# Patient Record
Sex: Female | Born: 1950
Health system: Southern US, Community
[De-identification: ages and names within clinical notes are randomized; demographics above are authoritative.]

## PROBLEM LIST (undated history)

## (undated) DIAGNOSIS — I10 Essential (primary) hypertension: Secondary | ICD-10-CM

## (undated) DIAGNOSIS — E785 Hyperlipidemia, unspecified: Secondary | ICD-10-CM

## (undated) DIAGNOSIS — K219 Gastro-esophageal reflux disease without esophagitis: Secondary | ICD-10-CM

## (undated) DIAGNOSIS — E119 Type 2 diabetes mellitus without complications: Secondary | ICD-10-CM

## (undated) DIAGNOSIS — H269 Unspecified cataract: Secondary | ICD-10-CM

## (undated) DIAGNOSIS — M810 Age-related osteoporosis without current pathological fracture: Secondary | ICD-10-CM

## (undated) DIAGNOSIS — T7840XA Allergy, unspecified, initial encounter: Secondary | ICD-10-CM

## (undated) HISTORY — PX: POLYPECTOMY: SHX149

## (undated) HISTORY — DX: Allergy, unspecified, initial encounter: T78.40XA

## (undated) HISTORY — PX: COLONOSCOPY: SHX174

## (undated) HISTORY — DX: Gastro-esophageal reflux disease without esophagitis: K21.9

## (undated) HISTORY — DX: Age-related osteoporosis without current pathological fracture: M81.0

## (undated) HISTORY — DX: Hyperlipidemia, unspecified: E78.5

## (undated) HISTORY — DX: Essential (primary) hypertension: I10

## (undated) HISTORY — DX: Type 2 diabetes mellitus without complications: E11.9

## (undated) HISTORY — DX: Unspecified cataract: H26.9

## (undated) HISTORY — PX: TUBAL LIGATION: SHX77

---

## 1983-04-19 HISTORY — PX: AUGMENTATION MAMMAPLASTY: SUR837

## 1998-07-01 ENCOUNTER — Encounter: Payer: Self-pay | Admitting: Obstetrics and Gynecology

## 1998-07-01 ENCOUNTER — Ambulatory Visit (HOSPITAL_COMMUNITY): Admission: RE | Admit: 1998-07-01 | Discharge: 1998-07-01 | Payer: Self-pay | Admitting: Obstetrics and Gynecology

## 1998-07-08 ENCOUNTER — Other Ambulatory Visit: Admission: RE | Admit: 1998-07-08 | Discharge: 1998-07-08 | Payer: Self-pay | Admitting: Obstetrics and Gynecology

## 2000-09-12 ENCOUNTER — Ambulatory Visit (HOSPITAL_COMMUNITY): Admission: RE | Admit: 2000-09-12 | Discharge: 2000-09-12 | Payer: Self-pay

## 2000-09-12 ENCOUNTER — Other Ambulatory Visit: Admission: RE | Admit: 2000-09-12 | Discharge: 2000-09-12 | Payer: Self-pay | Admitting: Obstetrics and Gynecology

## 2000-09-12 ENCOUNTER — Encounter: Payer: Self-pay | Admitting: Obstetrics and Gynecology

## 2002-09-03 ENCOUNTER — Ambulatory Visit (HOSPITAL_COMMUNITY): Admission: RE | Admit: 2002-09-03 | Discharge: 2002-09-03 | Payer: Self-pay | Admitting: Obstetrics and Gynecology

## 2002-09-03 ENCOUNTER — Encounter: Payer: Self-pay | Admitting: Obstetrics and Gynecology

## 2004-05-03 ENCOUNTER — Ambulatory Visit (HOSPITAL_COMMUNITY): Admission: RE | Admit: 2004-05-03 | Discharge: 2004-05-03 | Payer: Self-pay | Admitting: Obstetrics and Gynecology

## 2005-06-23 ENCOUNTER — Ambulatory Visit (HOSPITAL_COMMUNITY): Admission: RE | Admit: 2005-06-23 | Discharge: 2005-06-23 | Payer: Self-pay | Admitting: Obstetrics and Gynecology

## 2005-06-23 ENCOUNTER — Other Ambulatory Visit: Admission: RE | Admit: 2005-06-23 | Discharge: 2005-06-23 | Payer: Self-pay | Admitting: Obstetrics and Gynecology

## 2007-03-29 ENCOUNTER — Ambulatory Visit (HOSPITAL_COMMUNITY): Admission: RE | Admit: 2007-03-29 | Discharge: 2007-03-29 | Payer: Self-pay | Admitting: Obstetrics and Gynecology

## 2007-06-27 ENCOUNTER — Ambulatory Visit: Payer: Self-pay | Admitting: Internal Medicine

## 2007-07-17 ENCOUNTER — Ambulatory Visit (HOSPITAL_COMMUNITY): Admission: RE | Admit: 2007-07-17 | Discharge: 2007-07-17 | Payer: Self-pay | Admitting: Internal Medicine

## 2007-07-17 ENCOUNTER — Encounter: Payer: Self-pay | Admitting: Internal Medicine

## 2007-07-19 ENCOUNTER — Ambulatory Visit: Payer: Self-pay | Admitting: Internal Medicine

## 2007-08-24 ENCOUNTER — Ambulatory Visit (HOSPITAL_COMMUNITY): Admission: RE | Admit: 2007-08-24 | Discharge: 2007-08-24 | Payer: Self-pay | Admitting: Obstetrics and Gynecology

## 2007-08-24 ENCOUNTER — Encounter (INDEPENDENT_AMBULATORY_CARE_PROVIDER_SITE_OTHER): Payer: Self-pay | Admitting: Obstetrics and Gynecology

## 2008-04-30 ENCOUNTER — Ambulatory Visit (HOSPITAL_COMMUNITY): Admission: RE | Admit: 2008-04-30 | Discharge: 2008-04-30 | Payer: Self-pay | Admitting: Obstetrics and Gynecology

## 2009-05-07 ENCOUNTER — Ambulatory Visit (HOSPITAL_COMMUNITY): Admission: RE | Admit: 2009-05-07 | Discharge: 2009-05-07 | Payer: Self-pay | Admitting: Obstetrics and Gynecology

## 2010-05-18 ENCOUNTER — Ambulatory Visit (HOSPITAL_COMMUNITY)
Admission: RE | Admit: 2010-05-18 | Discharge: 2010-05-18 | Payer: Self-pay | Source: Home / Self Care | Attending: Obstetrics and Gynecology | Admitting: Obstetrics and Gynecology

## 2010-08-31 NOTE — Op Note (Signed)
Tara Chung, Tara Chung             ACCOUNT NO.:  [id number]   MEDICAL RECORD NO.:  [id number]          PATIENT TYPE:  AMB   LOCATION:  SDC                           FACILITY:  [facility]   PHYSICIAN:  Guy Sandifer. Henderson Cloud, M.D. DATE OF BIRTH:  09/19/1950   DATE OF PROCEDURE:  08/24/2007  DATE OF DISCHARGE:                               OPERATIVE REPORT   PREOPERATIVE DIAGNOSIS:  Positive endocervical curettage.   POSTOPERATIVE DIAGNOSIS:  Positive endocervical curettage.   PROCEDURE:  Cold-knife conization of cervix.   SURGEON:  Guy Sandifer. Henderson Cloud, MD   ANESTHESIA:  MAC.   SPECIMENS:  Cervical cone to pathology.   ESTIMATED BLOOD LOSS:  Drops.   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  Directed cervical biopsies were atypical.  Colon-knife conization was discussed with the patient.  Potential risks  and complications were discussed preoperatively including but limited to  infection, organ damage, bleeding requiring transfusion of blood  products with possible HIV and hepatitis acquisition, postoperative  dyspareunia, and postoperative bleeding.  All questions were then  answered and consent was signed on the chart.   PROCEDURE:  The patient is taken to the operating room where she is  placed in dorsal supine position and given intravenous anesthetic.  She  is then placed in dorsal lithotomy position where she is prepped on the  vulva and the vaginal introitus with soap and she is straight  catheterized.  She is draped in sterile fashion.  Bivalve speculum is  placed.  A 0-Vicryl suture is placed at 3 and 9 o'clock position on the  cervix using anchoring sutures.  Colposcopy is then performed.  Two  small areas of acetowhite epithelium are noted.  Lugol's is applied and  the corresponding areas of acetowhite epithelium are noted.  Colposcope  was then removed.   Cold-knife conization is then carried out with  scalpel removing the vast majority of the endocervix and then  incorporating the areas of acetowhite epithelium as well.  Specimen is  marked with a suture at 12 o'clock.  Cautery produces excellent  hemostasis.  A Monsel's soaked Gelfoam is placed in the cone bed in the  3 and 9 o'clock.  Sutures are then tied together in the midline.  All  counts are correct.  The patient is awakened and taken to the recovery  room in stable condition.      Guy Sandifer Henderson Cloud, M.D.  Electronically Signed    JET/MEDQ  D:  08/24/2007  T:  08/24/2007  Job:  [id number]

## 2010-08-31 NOTE — H&P (Signed)
Tara Chung, Tara Chung             ACCOUNT NO.:  [id number]   MEDICAL RECORD NO.:  [id number]          PATIENT TYPE:   LOCATION:                                 FACILITY:   PHYSICIAN:  Guy Sandifer. Henderson Cloud, M.D.      DATE OF BIRTH:   DATE OF ADMISSION:  DATE OF DISCHARGE:                              HISTORY & PHYSICAL   CHIEF COMPLAINT:  Abnormal Pap smear.   HISTORY OF PRESENT ILLNESS:  The patient is a 60 year old divorced white  female with a last menstrual period in the year 2000.  Her Pap smear in  February of this year was consistent with high-grade disease.  She was  taken to colposcopy in March of this year, at which time cervical  biopsies were consistent with cholecystic atypia.  The ECC had a  detached fragment of dysplastic squamous mucosa consistent with a high-  grade lesion.  Cold knife conization of the cervix was recommended.  Potential risks and complications have been discussed preoperatively.   PAST MEDICAL HISTORY:  Negative.   PAST SURGICAL HISTORY:  Negative.   SOCIAL HISTORY:  The patient smokes half a pack of cigarettes a day,  consumes alcohol on a social basis, denies drug abuse.   MEDICATIONS:  Multivitamins.   ALLERGIES:  NO KNOWN DRUG ALLERGIES.   FAMILY HISTORY:  Positive for diabetes, hypertension and heart disease.   REVIEW OF SYSTEMS:  NEURO:  Denies headache.  CARDIAC:  Denies chest  pain.  PULMONARY:  Denies shortness of breath.  GI:  Denies recent  changes in bowel habits.   PHYSICAL EXAMINATION:  Height 5 feet 2-1/2 inches, weight 131 pounds,  blood pressure 132/76.  HEENT: Without thyromegaly.  LUNGS:  Clear to auscultation.  HEART:  Regular rate and rhythm.  BACK:  Without CVA tenderness.  BREASTS:  No mass or discharge.  ABDOMEN:  Soft, nontender without masses.  PELVIC EXAM:  Vulva, vagina and cervix without lesion.  Uterus is normal  size, mobile, nontender.  Adnexa nontender without masses.  EXTREMITIES:  Grossly within normal  limits.  NEUROLOGICAL:  Exam grossly within normal limits.   ASSESSMENT:  High-grade Pap smear with inadequate colposcopy.   PLAN:  Cold knife conization of cervix.      Guy Sandifer Henderson Cloud, M.D.  Electronically Signed     JET/MEDQ  D:  08/23/2007  T:  08/23/2007  Job:  [id number]

## 2011-05-10 ENCOUNTER — Other Ambulatory Visit (HOSPITAL_COMMUNITY): Payer: Self-pay | Admitting: Obstetrics and Gynecology

## 2011-05-10 DIAGNOSIS — Z1231 Encounter for screening mammogram for malignant neoplasm of breast: Secondary | ICD-10-CM

## 2011-06-08 ENCOUNTER — Ambulatory Visit (HOSPITAL_COMMUNITY)
Admission: RE | Admit: 2011-06-08 | Discharge: 2011-06-08 | Disposition: A | Discharge status: Home / Self Care | Payer: 59 | Source: Ambulatory Visit | Attending: Obstetrics and Gynecology | Admitting: Obstetrics and Gynecology

## 2011-06-08 DIAGNOSIS — Z1231 Encounter for screening mammogram for malignant neoplasm of breast: Secondary | ICD-10-CM | POA: Insufficient documentation

## 2012-01-02 ENCOUNTER — Telehealth: Payer: Self-pay | Admitting: Cardiology

## 2012-01-02 NOTE — Telephone Encounter (Signed)
Patient returning nurse Larita Fife call, she can be reached on mobile #

## 2012-01-02 NOTE — Telephone Encounter (Signed)
Patient was calling regarding mother, spoke with [facility] LPN and documentation done in patients chart

## 2012-06-13 ENCOUNTER — Other Ambulatory Visit (HOSPITAL_COMMUNITY): Payer: Self-pay | Admitting: Obstetrics and Gynecology

## 2012-06-13 DIAGNOSIS — Z1231 Encounter for screening mammogram for malignant neoplasm of breast: Secondary | ICD-10-CM

## 2012-06-21 ENCOUNTER — Ambulatory Visit (HOSPITAL_COMMUNITY)
Admission: RE | Admit: 2012-06-21 | Discharge: 2012-06-21 | Disposition: A | Discharge status: Home / Self Care | Payer: 59 | Source: Ambulatory Visit | Attending: Obstetrics and Gynecology | Admitting: Obstetrics and Gynecology

## 2012-06-21 DIAGNOSIS — Z1231 Encounter for screening mammogram for malignant neoplasm of breast: Secondary | ICD-10-CM

## 2013-04-01 ENCOUNTER — Other Ambulatory Visit: Payer: Self-pay | Admitting: Otolaryngology

## 2013-04-01 DIAGNOSIS — H9319 Tinnitus, unspecified ear: Secondary | ICD-10-CM

## 2013-04-15 ENCOUNTER — Other Ambulatory Visit: Payer: 59

## 2013-04-24 ENCOUNTER — Other Ambulatory Visit (HOSPITAL_COMMUNITY): Payer: Self-pay | Admitting: Otolaryngology

## 2013-04-24 DIAGNOSIS — H9319 Tinnitus, unspecified ear: Secondary | ICD-10-CM

## 2013-04-26 ENCOUNTER — Other Ambulatory Visit: Payer: 59

## 2013-05-03 ENCOUNTER — Ambulatory Visit (HOSPITAL_COMMUNITY): Payer: 59

## 2013-05-08 ENCOUNTER — Other Ambulatory Visit (HOSPITAL_COMMUNITY): Payer: Self-pay | Admitting: Otolaryngology

## 2013-05-08 DIAGNOSIS — H9319 Tinnitus, unspecified ear: Secondary | ICD-10-CM

## 2013-05-09 ENCOUNTER — Ambulatory Visit (HOSPITAL_COMMUNITY)
Admission: RE | Admit: 2013-05-09 | Discharge: 2013-05-09 | Disposition: A | Discharge status: Home / Self Care | Payer: 59 | Source: Ambulatory Visit | Attending: Otolaryngology | Admitting: Otolaryngology

## 2013-05-09 DIAGNOSIS — H9319 Tinnitus, unspecified ear: Secondary | ICD-10-CM

## 2013-05-09 DIAGNOSIS — Z5309 Procedure and treatment not carried out because of other contraindication: Secondary | ICD-10-CM | POA: Insufficient documentation

## 2013-05-09 LAB — CREATININE, SERUM
Creatinine, Ser: 4.56 mg/dL — ABNORMAL HIGH (ref 0.50–1.10)
GFR calc Af Amer: 11 mL/min — ABNORMAL LOW (ref >90)
GFR calc non Af Amer: 9 mL/min — ABNORMAL LOW (ref >90)

## 2013-05-15 ENCOUNTER — Other Ambulatory Visit (HOSPITAL_COMMUNITY): Payer: Self-pay | Admitting: Obstetrics and Gynecology

## 2013-05-15 DIAGNOSIS — Z1231 Encounter for screening mammogram for malignant neoplasm of breast: Secondary | ICD-10-CM

## 2013-05-24 ENCOUNTER — Ambulatory Visit (HOSPITAL_COMMUNITY): Admission: RE | Admit: 2013-05-24 | Payer: 59 | Source: Ambulatory Visit

## 2013-05-24 ENCOUNTER — Ambulatory Visit (HOSPITAL_COMMUNITY): Payer: 59

## 2013-05-28 ENCOUNTER — Other Ambulatory Visit (HOSPITAL_COMMUNITY): Payer: 59

## 2013-06-24 ENCOUNTER — Ambulatory Visit (HOSPITAL_COMMUNITY)
Admission: RE | Admit: 2013-06-24 | Discharge: 2013-06-24 | Disposition: A | Discharge status: Home / Self Care | Payer: 59 | Source: Ambulatory Visit | Attending: Obstetrics and Gynecology | Admitting: Obstetrics and Gynecology

## 2013-06-24 DIAGNOSIS — Z1231 Encounter for screening mammogram for malignant neoplasm of breast: Secondary | ICD-10-CM | POA: Insufficient documentation

## 2014-01-22 ENCOUNTER — Other Ambulatory Visit: Payer: Self-pay | Admitting: Obstetrics and Gynecology

## 2014-01-23 LAB — CYTOLOGY - PAP

## 2014-06-03 ENCOUNTER — Encounter: Payer: Self-pay | Admitting: Internal Medicine

## 2014-06-09 ENCOUNTER — Other Ambulatory Visit (HOSPITAL_COMMUNITY): Payer: Self-pay | Admitting: Obstetrics and Gynecology

## 2014-06-09 DIAGNOSIS — Z1231 Encounter for screening mammogram for malignant neoplasm of breast: Secondary | ICD-10-CM

## 2014-07-01 ENCOUNTER — Ambulatory Visit (HOSPITAL_COMMUNITY)
Admission: RE | Admit: 2014-07-01 | Discharge: 2014-07-01 | Disposition: A | Discharge status: Home / Self Care | Payer: 59 | Source: Ambulatory Visit | Attending: Obstetrics and Gynecology | Admitting: Obstetrics and Gynecology

## 2014-07-01 DIAGNOSIS — Z1231 Encounter for screening mammogram for malignant neoplasm of breast: Secondary | ICD-10-CM | POA: Diagnosis not present

## 2014-11-25 ENCOUNTER — Encounter: Payer: Self-pay | Admitting: Internal Medicine

## 2015-04-23 MED FILL — LOSARTAN POTASSIUM 100 MG T: 100 | 90 days supply | Qty: 90 | Fill #0

## 2015-06-01 DIAGNOSIS — I10 Essential (primary) hypertension: Secondary | ICD-10-CM | POA: Diagnosis not present

## 2015-06-01 DIAGNOSIS — D126 Benign neoplasm of colon, unspecified: Secondary | ICD-10-CM | POA: Diagnosis not present

## 2015-06-01 DIAGNOSIS — Z1389 Encounter for screening for other disorder: Secondary | ICD-10-CM | POA: Diagnosis not present

## 2015-06-01 DIAGNOSIS — R7301 Impaired fasting glucose: Secondary | ICD-10-CM | POA: Diagnosis not present

## 2015-06-01 DIAGNOSIS — Z6826 Body mass index (BMI) 26.0-26.9, adult: Secondary | ICD-10-CM | POA: Diagnosis not present

## 2015-06-01 DIAGNOSIS — E784 Other hyperlipidemia: Secondary | ICD-10-CM | POA: Diagnosis not present

## 2015-06-01 DIAGNOSIS — R42 Dizziness and giddiness: Secondary | ICD-10-CM | POA: Diagnosis not present

## 2015-06-05 ENCOUNTER — Other Ambulatory Visit: Payer: Self-pay

## 2015-06-05 DIAGNOSIS — Z1231 Encounter for screening mammogram for malignant neoplasm of breast: Secondary | ICD-10-CM

## 2015-07-02 ENCOUNTER — Ambulatory Visit
Admission: RE | Admit: 2015-07-02 | Discharge: 2015-07-02 | Disposition: A | Discharge status: Home / Self Care | Payer: 59 | Source: Ambulatory Visit

## 2015-07-02 DIAGNOSIS — Z1231 Encounter for screening mammogram for malignant neoplasm of breast: Secondary | ICD-10-CM

## 2015-07-07 DIAGNOSIS — H35039 Hypertensive retinopathy, unspecified eye: Secondary | ICD-10-CM | POA: Diagnosis not present

## 2015-07-07 DIAGNOSIS — I10 Essential (primary) hypertension: Secondary | ICD-10-CM | POA: Diagnosis not present

## 2015-07-07 DIAGNOSIS — H25812 Combined forms of age-related cataract, left eye: Secondary | ICD-10-CM | POA: Diagnosis not present

## 2015-07-07 DIAGNOSIS — H52223 Regular astigmatism, bilateral: Secondary | ICD-10-CM | POA: Diagnosis not present

## 2015-07-07 DIAGNOSIS — H5213 Myopia, bilateral: Secondary | ICD-10-CM | POA: Diagnosis not present

## 2015-10-06 MED FILL — LOSARTAN POTASSIUM 100 MG T: 100 | 90 days supply | Qty: 90 | Fill #0

## 2016-01-26 MED FILL — LOSARTAN POTASSIUM 100 MG T: 100 | 60 days supply | Qty: 60 | Fill #1

## 2016-03-16 DIAGNOSIS — R8299 Other abnormal findings in urine: Secondary | ICD-10-CM | POA: Diagnosis not present

## 2016-03-16 DIAGNOSIS — E784 Other hyperlipidemia: Secondary | ICD-10-CM | POA: Diagnosis not present

## 2016-03-16 DIAGNOSIS — R3129 Other microscopic hematuria: Secondary | ICD-10-CM | POA: Diagnosis not present

## 2016-03-16 DIAGNOSIS — Z6827 Body mass index (BMI) 27.0-27.9, adult: Secondary | ICD-10-CM | POA: Diagnosis not present

## 2016-03-16 DIAGNOSIS — R252 Cramp and spasm: Secondary | ICD-10-CM | POA: Diagnosis not present

## 2016-03-16 DIAGNOSIS — R7301 Impaired fasting glucose: Secondary | ICD-10-CM | POA: Diagnosis not present

## 2016-03-16 DIAGNOSIS — D126 Benign neoplasm of colon, unspecified: Secondary | ICD-10-CM | POA: Diagnosis not present

## 2016-03-16 DIAGNOSIS — I10 Essential (primary) hypertension: Secondary | ICD-10-CM | POA: Diagnosis not present

## 2016-03-16 MED FILL — LOSARTAN POTASSIUM 100 MG T: 100 | 30 days supply | Qty: 30 | Fill #0

## 2016-03-21 MED FILL — NITROFURANTOIN MONO-MCR 100: 100 | 7 days supply | Qty: 14 | Fill #0

## 2016-03-21 MED FILL — LEVOTHYROXINE 100 MCG TAB: 100 | 90 days supply | Qty: 90 | Fill #0

## 2016-04-07 MED FILL — AMOXICILLIN 500 MG CAPSULE: 500 | 7 days supply | Qty: 24 | Fill #0

## 2016-04-14 MED FILL — CHLORHEXIDINE 0.12% RINSE: 0.12 | 15 days supply | Qty: 473 | Fill #0

## 2016-05-03 DIAGNOSIS — Z6827 Body mass index (BMI) 27.0-27.9, adult: Secondary | ICD-10-CM | POA: Diagnosis not present

## 2016-05-03 DIAGNOSIS — Z01419 Encounter for gynecological examination (general) (routine) without abnormal findings: Secondary | ICD-10-CM | POA: Diagnosis not present

## 2016-05-30 ENCOUNTER — Other Ambulatory Visit: Payer: Self-pay | Admitting: Obstetrics and Gynecology

## 2016-05-30 DIAGNOSIS — Z1231 Encounter for screening mammogram for malignant neoplasm of breast: Secondary | ICD-10-CM

## 2016-06-08 MED FILL — LOSARTAN POTASSIUM 100 MG T: 100 | 90 days supply | Qty: 90 | Fill #1

## 2016-06-08 MED FILL — CHLORHEXIDINE 0.12% RINSE: 0.12 | 17 days supply | Qty: 473 | Fill #0

## 2016-06-21 DIAGNOSIS — H52223 Regular astigmatism, bilateral: Secondary | ICD-10-CM | POA: Diagnosis not present

## 2016-06-21 DIAGNOSIS — H524 Presbyopia: Secondary | ICD-10-CM | POA: Diagnosis not present

## 2016-06-21 DIAGNOSIS — H5213 Myopia, bilateral: Secondary | ICD-10-CM | POA: Diagnosis not present

## 2016-06-21 DIAGNOSIS — H25813 Combined forms of age-related cataract, bilateral: Secondary | ICD-10-CM | POA: Diagnosis not present

## 2016-06-23 MED FILL — AMOXICILLIN 875 MG TABLET: 875 | 10 days supply | Qty: 20 | Fill #0

## 2016-06-23 MED FILL — HYDROCODON-APAP 5-325: 5-325 | 3 days supply | Qty: 20 | Fill #0

## 2016-06-28 DIAGNOSIS — E038 Other specified hypothyroidism: Secondary | ICD-10-CM | POA: Diagnosis not present

## 2016-06-28 MED FILL — NAPROXEN SODIUM 550 MG TAB: 550 | 4 days supply | Qty: 12 | Fill #0

## 2016-06-28 MED FILL — AZITHROMYCIN 500 MG TABLET: 500 | 3 days supply | Qty: 3 | Fill #0

## 2016-07-05 MED FILL — NAPROXEN SODIUM 550 MG TAB: 550 | 4 days supply | Qty: 12 | Fill #1

## 2016-07-06 ENCOUNTER — Ambulatory Visit
Admission: RE | Admit: 2016-07-06 | Discharge: 2016-07-06 | Disposition: A | Discharge status: Home / Self Care | Payer: 59 | Source: Ambulatory Visit | Attending: Obstetrics and Gynecology | Admitting: Obstetrics and Gynecology

## 2016-07-06 DIAGNOSIS — Z1231 Encounter for screening mammogram for malignant neoplasm of breast: Secondary | ICD-10-CM | POA: Diagnosis not present

## 2016-10-12 MED FILL — LOSARTAN POTASSIUM 100 MG T: 100 | 30 days supply | Qty: 30 | Fill #2

## 2016-11-30 MED FILL — LOSARTAN POTASSIUM 100 MG T: 100 | 30 days supply | Qty: 30 | Fill #0

## 2016-12-28 MED FILL — LOSARTAN POTASSIUM 100 MG T: 100 | 90 days supply | Qty: 90 | Fill #1

## 2017-01-23 MED FILL — CHLORHEXIDINE 0.12% RINSE: 0.12 | 17 days supply | Qty: 473 | Fill #0

## 2017-01-23 MED FILL — AMOXICILLIN 875 MG TABLET: 875 | 10 days supply | Qty: 20 | Fill #0

## 2017-01-23 MED FILL — NAPROXEN SODIUM 550 MG TAB: 550 | 5 days supply | Qty: 15 | Fill #0

## 2017-02-22 ENCOUNTER — Telehealth: Payer: Self-pay | Admitting: Gastroenterology

## 2017-02-23 ENCOUNTER — Telehealth: Payer: 59 | Admitting: Physician Assistant

## 2017-02-23 DIAGNOSIS — B9689 Other specified bacterial agents as the cause of diseases classified elsewhere: Secondary | ICD-10-CM | POA: Diagnosis not present

## 2017-02-23 DIAGNOSIS — J208 Acute bronchitis due to other specified organisms: Secondary | ICD-10-CM

## 2017-02-23 MED ORDER — DOXYCYCLINE HYCLATE 100 MG PO CAPS: 100.0000 mg | ORAL_CAPSULE | Freq: Two times a day (BID) | ORAL | 0 refills | Status: DC

## 2017-02-23 MED FILL — DOXYCYCLINE HYCLATE 100 MG: 100 | 7 days supply | Qty: 14 | Fill #0

## 2017-02-23 NOTE — Progress Notes (Signed)
We are sorry that you are not feeling well.  Here is how we plan to help!  Based on your presentation I believe you most likely have A cough due to bacteria.  When patients have a fever and a productive cough with a change in color or increased sputum production, we are concerned about bacterial bronchitis.  If left untreated it can progress to pneumonia.  If your symptoms do not improve with your treatment plan it is important that you contact your provider.   I have prescribed Doxycycline 100 mg twice a day for 7 days     In addition you may use A non-prescription cough medication called Mucinex DM: take 2 tablets every 12 hours.   From your responses in the eVisit questionnaire you describe inflammation in the upper respiratory tract which is causing a significant cough.  This is commonly called Bronchitis and has four common causes:    Allergies  Viral Infections  Acid Reflux  Bacterial Infection Allergies, viruses and acid reflux are treated by controlling symptoms or eliminating the cause. An example might be a cough caused by taking certain blood pressure medications. You stop the cough by changing the medication. Another example might be a cough caused by acid reflux. Controlling the reflux helps control the cough.  USE OF BRONCHODILATOR ("RESCUE") INHALERS: There is a risk from using your bronchodilator too frequently.  The risk is that over-reliance on a medication which only relaxes the muscles surrounding the breathing tubes can reduce the effectiveness of medications prescribed to reduce swelling and congestion of the tubes themselves.  Although you feel brief relief from the bronchodilator inhaler, your asthma may actually be worsening with the tubes becoming more swollen and filled with mucus.  This can delay other crucial treatments, such as oral steroid medications. If you need to use a bronchodilator inhaler daily, several times per day, you should discuss this with your  provider.  There are probably better treatments that could be used to keep your asthma under control.     HOME CARE . Only take medications as instructed by your medical team. . Complete the entire course of an antibiotic. . Drink plenty of fluids and get plenty of rest. . Avoid close contacts especially the very young and the elderly . Cover your mouth if you cough or cough into your sleeve. . Always remember to wash your hands . A steam or ultrasonic humidifier can help congestion.   GET HELP RIGHT AWAY IF: . You develop worsening fever. . You become short of breath . You cough up blood. . Your symptoms persist after you have completed your treatment plan MAKE SURE YOU   Understand these instructions.  Will watch your condition.  Will get help right away if you are not doing well or get worse.  Your e-visit answers were reviewed by a board certified advanced clinical practitioner to complete your personal care plan.  Depending on the condition, your plan could have included both over the counter or prescription medications. If there is a problem please reply  once you have received a response from your provider. Your safety is important to us.  If you have drug allergies check your prescription carefully.    You can use MyChart to ask questions about today's visit, request a non-urgent call back, or ask for a work or school excuse for 24 hours related to this e-Visit. If it has been greater than 24 hours you will need to follow up with your provider,   or enter a new e-Visit to address those concerns. You will get an e-mail in the next two days asking about your experience.  I hope that your e-visit has been valuable and will speed your recovery. Thank you for using e-visits.   

## 2017-03-01 NOTE — Telephone Encounter (Signed)
Patient calling back to check status on this review. Patient states she was suppose to be due in 2016, so she doesn't think she needs to wait till 06-2017 for recall. Pt also a [employer] employee that is planning to retire in spring of 2019 and wants colon before then.

## 2017-03-01 NOTE — Telephone Encounter (Signed)
Spoke with patient and notified her that she can sch now. Patient wants procedure in February, so will cb when schedule opens.

## 2017-03-01 NOTE — Telephone Encounter (Signed)
Dr Silverio Decamp this report is on your desk but its in Epic  It looks like to me she is past due for a colonoscopy, she had tubular adenoma and hyperplastic polyps in 2009  Can she be scheduled for a direct colon?

## 2017-03-01 NOTE — Telephone Encounter (Signed)
Please schedule direct colonoscopy. Pre visit RN appt.  Patient is past due. Thanks

## 2017-03-03 MED FILL — CHLORHEXIDINE 0.12% RINSE: 0.12 | 17 days supply | Qty: 473 | Fill #1

## 2017-03-21 DIAGNOSIS — E7849 Other hyperlipidemia: Secondary | ICD-10-CM | POA: Diagnosis not present

## 2017-03-21 DIAGNOSIS — I10 Essential (primary) hypertension: Secondary | ICD-10-CM | POA: Diagnosis not present

## 2017-03-21 DIAGNOSIS — Z6827 Body mass index (BMI) 27.0-27.9, adult: Secondary | ICD-10-CM | POA: Diagnosis not present

## 2017-03-21 DIAGNOSIS — R252 Cramp and spasm: Secondary | ICD-10-CM | POA: Diagnosis not present

## 2017-03-21 DIAGNOSIS — E038 Other specified hypothyroidism: Secondary | ICD-10-CM | POA: Diagnosis not present

## 2017-03-21 DIAGNOSIS — R7301 Impaired fasting glucose: Secondary | ICD-10-CM | POA: Diagnosis not present

## 2017-04-05 ENCOUNTER — Encounter: Payer: Self-pay | Admitting: Gastroenterology

## 2017-04-24 MED FILL — LOSARTAN POTASSIUM 100 MG T: 100 | 90 days supply | Qty: 90 | Fill #0

## 2017-05-12 ENCOUNTER — Other Ambulatory Visit: Payer: Self-pay

## 2017-05-12 ENCOUNTER — Ambulatory Visit (AMBULATORY_SURGERY_CENTER): Payer: Self-pay | Admitting: *Deleted

## 2017-05-12 VITALS — Ht 62.0 in | Wt 149.0 lb

## 2017-05-12 DIAGNOSIS — Z86010 Personal history of colonic polyps: Secondary | ICD-10-CM

## 2017-05-12 MED ORDER — NA SULFATE-K SULFATE-MG SULF 17.5-3.13-1.6 GM/177ML PO SOLN: 1.0000 [IU] | Freq: Once | ORAL | 0 refills | Status: AC

## 2017-05-12 MED FILL — SUPREP BOWEL PREP KIT: 17.5-3.13-1 | 1 days supply | Qty: 354 | Fill #0

## 2017-05-12 MED FILL — PREVNAR 13 SYRINGE: 1 days supply | Qty: 1 | Fill #0

## 2017-05-12 NOTE — Progress Notes (Signed)
No egg or soy allergy known to patient  No issues with past sedation with any surgeries  or procedures, no intubation problems  No diet pills per patient No home 02 use per patient  No blood thinners per patient  Pt denies issues with constipation  No A fib or A flutter  EMMI video sent to pt's e mail  Pt. declined 

## 2017-05-15 ENCOUNTER — Encounter: Payer: Self-pay | Admitting: Gastroenterology

## 2017-05-15 MED FILL — SHINGRIX VIAL KIT: 50 | 1 days supply | Qty: 1 | Fill #0

## 2017-05-17 DIAGNOSIS — Z6827 Body mass index (BMI) 27.0-27.9, adult: Secondary | ICD-10-CM | POA: Diagnosis not present

## 2017-05-17 DIAGNOSIS — Z01419 Encounter for gynecological examination (general) (routine) without abnormal findings: Secondary | ICD-10-CM | POA: Diagnosis not present

## 2017-05-17 DIAGNOSIS — Z13820 Encounter for screening for osteoporosis: Secondary | ICD-10-CM | POA: Diagnosis not present

## 2017-05-24 ENCOUNTER — Other Ambulatory Visit: Payer: Self-pay | Admitting: Obstetrics and Gynecology

## 2017-05-24 DIAGNOSIS — Z1231 Encounter for screening mammogram for malignant neoplasm of breast: Secondary | ICD-10-CM

## 2017-05-26 ENCOUNTER — Ambulatory Visit (AMBULATORY_SURGERY_CENTER): Payer: 59 | Admitting: Gastroenterology

## 2017-05-26 ENCOUNTER — Other Ambulatory Visit: Payer: Self-pay

## 2017-05-26 ENCOUNTER — Encounter: Payer: Self-pay | Admitting: Gastroenterology

## 2017-05-26 VITALS — BP 136/61 | HR 65 | Temp 98.4°F | Resp 12 | Ht 62.0 in | Wt 149.0 lb

## 2017-05-26 DIAGNOSIS — D122 Benign neoplasm of ascending colon: Secondary | ICD-10-CM | POA: Diagnosis not present

## 2017-05-26 DIAGNOSIS — Z1211 Encounter for screening for malignant neoplasm of colon: Secondary | ICD-10-CM | POA: Diagnosis not present

## 2017-05-26 DIAGNOSIS — D125 Benign neoplasm of sigmoid colon: Secondary | ICD-10-CM

## 2017-05-26 DIAGNOSIS — D120 Benign neoplasm of cecum: Secondary | ICD-10-CM | POA: Diagnosis not present

## 2017-05-26 DIAGNOSIS — D123 Benign neoplasm of transverse colon: Secondary | ICD-10-CM | POA: Diagnosis not present

## 2017-05-26 DIAGNOSIS — D124 Benign neoplasm of descending colon: Secondary | ICD-10-CM

## 2017-05-26 DIAGNOSIS — K635 Polyp of colon: Secondary | ICD-10-CM | POA: Diagnosis not present

## 2017-05-26 DIAGNOSIS — Z86010 Personal history of colonic polyps: Secondary | ICD-10-CM

## 2017-05-26 MED ORDER — SODIUM CHLORIDE 0.9 % IV SOLN: 500.0000 mL | Freq: Once | INTRAVENOUS | Status: AC

## 2017-05-26 NOTE — Op Note (Signed)
[place] Patient Name: Tara Chung Procedure Date: 05/26/2017 2:04 PM MRN: [id number] Endoscopist: Mauri Pole , MD Age: 67 Referring MD:  Date of Birth: 11/18/1950 Gender: Female Account #: [id number] Procedure:                Colonoscopy Indications:              High risk colon cancer surveillance: Personal                            history of adenoma less than 10 mm in size, Last                            colonoscopy: 2009 Medicines:                Monitored Anesthesia Care Procedure:                Pre-Anesthesia Assessment:                           - Prior to the procedure, a History and Physical                            was performed, and patient medications and                            allergies were reviewed. The patient's tolerance of                            previous anesthesia was also reviewed. The risks                            and benefits of the procedure and the sedation                            options and risks were discussed with the patient.                            All questions were answered, and informed consent                            was obtained. Prior Anticoagulants: The patient has                            taken no previous anticoagulant or antiplatelet                            agents. ASA Grade Assessment: II - A patient with                            mild systemic disease. After reviewing the risks                            and benefits, the patient was deemed in  satisfactory condition to undergo the procedure.                           After obtaining informed consent, the colonoscope                            was passed under direct vision. Throughout the                            procedure, the patient's blood pressure, pulse, and                            oxygen saturations were monitored continuously. The                            Colonoscope was introduced through the  anus and                            advanced to the the terminal ileum, with                            identification of the appendiceal orifice and IC                            valve. The colonoscopy was performed without                            difficulty. The patient tolerated the procedure                            well. The quality of the bowel preparation was                            excellent. The terminal ileum, ileocecal valve,                            appendiceal orifice, and rectum were photographed. Scope In: 2:10:35 PM Scope Out: 2:23:55 PM Scope Withdrawal Time: 0 hours 9 minutes 40 seconds  Total Procedure Duration: 0 hours 13 minutes 20 seconds  Findings:                 The perianal and digital rectal examinations were                            normal.                           Three sessile polyps were found in the sigmoid                            colon and distal transverse colon. The polyps were                            5 to 8 mm in size. These polyps were removed with a  cold snare. Resection and retrieval were complete.                           Two sessile polyps were found in the descending                            colon and ascending colon. The polyps were 1 to 2                            mm in size. These polyps were removed with a cold                            biopsy forceps. Resection and retrieval were                            complete.                           A few small-mouthed diverticula were found in the                            sigmoid colon.                           Non-bleeding internal hemorrhoids were found during                            retroflexion. The hemorrhoids were small. Complications:            No immediate complications. Estimated Blood Loss:     Estimated blood loss was minimal. Impression:               - Three 5 to 8 mm polyps in the sigmoid colon and                             in the distal transverse colon, removed with a cold                            snare. Resected and retrieved.                           - Two 1 to 2 mm polyps in the descending colon and                            in the ascending colon, removed with a cold biopsy                            forceps. Resected and retrieved.                           - Diverticulosis in the sigmoid colon.                           - Non-bleeding internal hemorrhoids. Recommendation:           -  Patient has a contact number available for                            emergencies. The signs and symptoms of potential                            delayed complications were discussed with the                            patient. Return to normal activities tomorrow.                            Written discharge instructions were provided to the                            patient.                           - Resume previous diet.                           - Continue present medications.                           - Await pathology results.                           - Repeat colonoscopy in 3 - 5 years for                            surveillance based on pathology results. Mauri Pole, MD 05/26/2017 2:28:22 PM This report has been signed electronically.

## 2017-05-26 NOTE — Progress Notes (Signed)
Called to room to assist during endoscopic procedure.  Patient ID and intended procedure confirmed with present staff. Received instructions for my participation in the procedure from the performing physician.  

## 2017-05-26 NOTE — Patient Instructions (Signed)
YOU HAD AN ENDOSCOPIC PROCEDURE TODAY AT [facility]:   Refer to the procedure report that was given to you for any specific questions about what was found during the examination.  If the procedure report does not answer your questions, please call your gastroenterologist to clarify.  If you requested that your care partner not be given the details of your procedure findings, then the procedure report has been included in a sealed envelope for you to review at your convenience later.  YOU SHOULD EXPECT: Some feelings of bloating in the abdomen. Passage of more gas than usual.  Walking can help get rid of the air that was put into your GI tract during the procedure and reduce the bloating. If you had a lower endoscopy (such as a colonoscopy or flexible sigmoidoscopy) you may notice spotting of blood in your stool or on the toilet paper. If you underwent a bowel prep for your procedure, you may not have a normal bowel movement for a few days.  Please Note:  You might notice some irritation and congestion in your nose or some drainage.  This is from the oxygen used during your procedure.  There is no need for concern and it should clear up in a day or so.  SYMPTOMS TO REPORT IMMEDIATELY:   Following lower endoscopy (colonoscopy or flexible sigmoidoscopy):  Excessive amounts of blood in the stool  Significant tenderness or worsening of abdominal pains  Swelling of the abdomen that is new, acute  Fever of 100F or higher   Please see handouts on polyps, Diverticulosis and Hemorrhoids.  For urgent or emergent issues, a gastroenterologist can be reached at any hour by calling [phone].   DIET:  We do recommend a small meal at first, but then you may proceed to your regular diet.  Drink plenty of fluids but you should avoid alcoholic beverages for 24 hours.  ACTIVITY:  You should plan to take it easy for the rest of today and you should NOT DRIVE or use heavy machinery until  tomorrow (because of the sedation medicines used during the test).    FOLLOW UP: Our staff will call the number listed on your records the next business day following your procedure to check on you and address any questions or concerns that you may have regarding the information given to you following your procedure. If we do not reach you, we will leave a message.  However, if you are feeling well and you are not experiencing any problems, there is no need to return our call.  We will assume that you have returned to your regular daily activities without incident.  If any biopsies were taken you will be contacted by phone or by letter within the next 1-3 weeks.  Please call us at [phone] if you have not heard about the biopsies in 3 weeks.    SIGNATURES/CONFIDENTIALITY: You and/or your care partner have signed paperwork which will be entered into your electronic medical record.  These signatures attest to the fact that that the information above on your After Visit Summary has been reviewed and is understood.  Full responsibility of the confidentiality of this discharge information lies with you and/or your care-partner.  Thank you for letting us take care of your healthcare needs today.

## 2017-05-26 NOTE — Progress Notes (Signed)
A and O x3. Report to RN. Tolerated MAC anesthesia well.

## 2017-05-26 NOTE — Progress Notes (Signed)
Pt's states no medical or surgical changes since previsit or office visit. 

## 2017-05-29 ENCOUNTER — Telehealth: Payer: Self-pay | Admitting: *Deleted

## 2017-05-29 DIAGNOSIS — N879 Dysplasia of cervix uteri, unspecified: Secondary | ICD-10-CM | POA: Diagnosis not present

## 2017-05-29 DIAGNOSIS — N72 Inflammatory disease of cervix uteri: Secondary | ICD-10-CM | POA: Diagnosis not present

## 2017-05-29 NOTE — Telephone Encounter (Signed)
No answer, message left for the patient. 

## 2017-05-29 NOTE — Telephone Encounter (Signed)
Message left

## 2017-06-02 ENCOUNTER — Encounter: Payer: Self-pay | Admitting: Gastroenterology

## 2017-07-11 ENCOUNTER — Ambulatory Visit
Admission: RE | Admit: 2017-07-11 | Discharge: 2017-07-11 | Disposition: A | Discharge status: Home / Self Care | Payer: 59 | Source: Ambulatory Visit | Attending: Obstetrics and Gynecology | Admitting: Obstetrics and Gynecology

## 2017-07-11 DIAGNOSIS — Z1231 Encounter for screening mammogram for malignant neoplasm of breast: Secondary | ICD-10-CM | POA: Diagnosis not present

## 2017-07-20 DIAGNOSIS — H1045 Other chronic allergic conjunctivitis: Secondary | ICD-10-CM | POA: Diagnosis not present

## 2017-07-20 DIAGNOSIS — H52223 Regular astigmatism, bilateral: Secondary | ICD-10-CM | POA: Diagnosis not present

## 2017-07-20 DIAGNOSIS — H524 Presbyopia: Secondary | ICD-10-CM | POA: Diagnosis not present

## 2017-07-20 DIAGNOSIS — H25813 Combined forms of age-related cataract, bilateral: Secondary | ICD-10-CM | POA: Diagnosis not present

## 2017-07-20 DIAGNOSIS — H5213 Myopia, bilateral: Secondary | ICD-10-CM | POA: Diagnosis not present

## 2017-07-31 MED FILL — SHINGRIX VIAL KIT: 50 | 1 days supply | Qty: 1 | Fill #1

## 2017-08-21 MED FILL — LOSARTAN POTASSIUM 100 MG T: 100 | 30 days supply | Qty: 30 | Fill #1

## 2017-09-04 DIAGNOSIS — R87612 Low grade squamous intraepithelial lesion on cytologic smear of cervix (LGSIL): Secondary | ICD-10-CM | POA: Diagnosis not present

## 2017-09-04 DIAGNOSIS — R87618 Other abnormal cytological findings on specimens from cervix uteri: Secondary | ICD-10-CM | POA: Diagnosis not present

## 2017-10-03 MED FILL — LOSARTAN POTASSIUM 100 MG T: 100 | 30 days supply | Qty: 30 | Fill #0

## 2017-12-07 MED FILL — LOSARTAN POTASSIUM 100 MG T: 100 | 30 days supply | Qty: 30 | Fill #1

## 2018-01-10 MED FILL — LOSARTAN POTASSIUM 100 MG T: 100 | 30 days supply | Qty: 30 | Fill #2

## 2018-02-07 DIAGNOSIS — R87610 Atypical squamous cells of undetermined significance on cytologic smear of cervix (ASC-US): Secondary | ICD-10-CM | POA: Diagnosis not present

## 2018-02-07 DIAGNOSIS — N879 Dysplasia of cervix uteri, unspecified: Secondary | ICD-10-CM | POA: Diagnosis not present

## 2018-02-14 MED FILL — LOSARTAN POTASSIUM 100 MG T: 100 | 30 days supply | Qty: 30 | Fill #3

## 2018-03-29 MED FILL — LOSARTAN POTASSIUM 100 MG T: 100 | 90 days supply | Qty: 90 | Fill #0

## 2018-04-26 DIAGNOSIS — E038 Other specified hypothyroidism: Secondary | ICD-10-CM | POA: Diagnosis not present

## 2018-04-26 DIAGNOSIS — Z6826 Body mass index (BMI) 26.0-26.9, adult: Secondary | ICD-10-CM | POA: Diagnosis not present

## 2018-04-26 DIAGNOSIS — D126 Benign neoplasm of colon, unspecified: Secondary | ICD-10-CM | POA: Diagnosis not present

## 2018-04-26 DIAGNOSIS — E7849 Other hyperlipidemia: Secondary | ICD-10-CM | POA: Diagnosis not present

## 2018-04-26 DIAGNOSIS — R7301 Impaired fasting glucose: Secondary | ICD-10-CM | POA: Diagnosis not present

## 2018-04-26 DIAGNOSIS — Z87448 Personal history of other diseases of urinary system: Secondary | ICD-10-CM | POA: Diagnosis not present

## 2018-04-26 DIAGNOSIS — I10 Essential (primary) hypertension: Secondary | ICD-10-CM | POA: Diagnosis not present

## 2018-04-27 MED FILL — ROSUVASTATIN CALCIUM 5 MG T: 5 | 35 days supply | Qty: 10 | Fill #0

## 2018-05-21 DIAGNOSIS — Z124 Encounter for screening for malignant neoplasm of cervix: Secondary | ICD-10-CM | POA: Diagnosis not present

## 2018-05-21 DIAGNOSIS — Z6826 Body mass index (BMI) 26.0-26.9, adult: Secondary | ICD-10-CM | POA: Diagnosis not present

## 2018-05-21 DIAGNOSIS — M8580 Other specified disorders of bone density and structure, unspecified site: Secondary | ICD-10-CM | POA: Diagnosis not present

## 2018-05-21 DIAGNOSIS — Z01419 Encounter for gynecological examination (general) (routine) without abnormal findings: Secondary | ICD-10-CM | POA: Diagnosis not present

## 2018-06-07 MED FILL — ROSUVASTATIN CALCIUM 5 MG T: 5 | 84 days supply | Qty: 24 | Fill #0

## 2018-06-25 MED FILL — LOSARTAN POTASSIUM 100 MG T: 100 | 90 days supply | Qty: 90 | Fill #1

## 2018-08-23 ENCOUNTER — Other Ambulatory Visit: Payer: Self-pay | Admitting: Obstetrics and Gynecology

## 2018-08-23 DIAGNOSIS — Z1231 Encounter for screening mammogram for malignant neoplasm of breast: Secondary | ICD-10-CM

## 2018-08-27 MED FILL — ROSUVASTATIN CALCIUM 5 MG T: 5 | 84 days supply | Qty: 24 | Fill #0

## 2018-10-17 ENCOUNTER — Ambulatory Visit: Payer: 59

## 2018-10-29 MED FILL — LOSARTAN POTASSIUM 100 MG T: 100 | 90 days supply | Qty: 90 | Fill #0

## 2018-11-02 DIAGNOSIS — Z23 Encounter for immunization: Secondary | ICD-10-CM | POA: Diagnosis not present

## 2018-11-15 MED FILL — ROSUVASTATIN CALCIUM 5 MG T: 5 | 84 days supply | Qty: 24 | Fill #1

## 2018-11-29 ENCOUNTER — Ambulatory Visit
Admission: RE | Admit: 2018-11-29 | Discharge: 2018-11-29 | Disposition: A | Discharge status: Home / Self Care | Payer: Self-pay | Source: Ambulatory Visit | Attending: Obstetrics and Gynecology | Admitting: Obstetrics and Gynecology

## 2018-11-29 ENCOUNTER — Other Ambulatory Visit: Payer: Self-pay

## 2018-11-29 DIAGNOSIS — R87611 Atypical squamous cells cannot exclude high grade squamous intraepithelial lesion on cytologic smear of cervix (ASC-H): Secondary | ICD-10-CM | POA: Diagnosis not present

## 2018-11-29 DIAGNOSIS — Z87410 Personal history of cervical dysplasia: Secondary | ICD-10-CM | POA: Diagnosis not present

## 2018-11-29 DIAGNOSIS — Z1231 Encounter for screening mammogram for malignant neoplasm of breast: Secondary | ICD-10-CM

## 2018-12-05 MED FILL — HYDROCHLOROTHIAZIDE 12.5 MG: 12.5 | 90 days supply | Qty: 90 | Fill #0

## 2019-01-28 MED FILL — ROSUVASTATIN CALCIUM 5 MG T: 5 | 84 days supply | Qty: 24 | Fill #2

## 2019-01-30 MED FILL — LOSARTAN POTASSIUM 100 MG T: 100 | 90 days supply | Qty: 90 | Fill #0

## 2019-02-07 DIAGNOSIS — Z23 Encounter for immunization: Secondary | ICD-10-CM | POA: Diagnosis not present

## 2019-02-18 MED FILL — HYDROCHLOROTHIAZIDE 12.5 MG: 12.5 | 90 days supply | Qty: 90 | Fill #1

## 2019-03-13 DIAGNOSIS — F411 Generalized anxiety disorder: Secondary | ICD-10-CM | POA: Diagnosis not present

## 2019-03-13 MED FILL — ALPRAZolam 0.5 MG TABS: 0.5 | 30 days supply | Qty: 60 | Fill #0

## 2019-03-13 MED FILL — ESCITALOPRAM 10 MG TABLET: 10 | 90 days supply | Qty: 90 | Fill #0

## 2019-04-10 MED FILL — LOSARTAN POTASSIUM 100 MG T: 100 | 90 days supply | Qty: 90 | Fill #1

## 2019-04-29 DIAGNOSIS — R7301 Impaired fasting glucose: Secondary | ICD-10-CM | POA: Diagnosis not present

## 2019-04-29 DIAGNOSIS — E038 Other specified hypothyroidism: Secondary | ICD-10-CM | POA: Diagnosis not present

## 2019-04-29 DIAGNOSIS — F411 Generalized anxiety disorder: Secondary | ICD-10-CM | POA: Diagnosis not present

## 2019-04-29 DIAGNOSIS — E785 Hyperlipidemia, unspecified: Secondary | ICD-10-CM | POA: Diagnosis not present

## 2019-04-29 DIAGNOSIS — I10 Essential (primary) hypertension: Secondary | ICD-10-CM | POA: Diagnosis not present

## 2019-04-29 DIAGNOSIS — D126 Benign neoplasm of colon, unspecified: Secondary | ICD-10-CM | POA: Diagnosis not present

## 2019-04-29 MED FILL — AMLODIPINE BESYLATE 5 MG TA: 5 | 90 days supply | Qty: 90 | Fill #0

## 2019-05-01 DIAGNOSIS — E7849 Other hyperlipidemia: Secondary | ICD-10-CM | POA: Diagnosis not present

## 2019-05-01 DIAGNOSIS — R7301 Impaired fasting glucose: Secondary | ICD-10-CM | POA: Diagnosis not present

## 2019-05-01 DIAGNOSIS — E038 Other specified hypothyroidism: Secondary | ICD-10-CM | POA: Diagnosis not present

## 2019-05-01 DIAGNOSIS — I10 Essential (primary) hypertension: Secondary | ICD-10-CM | POA: Diagnosis not present

## 2019-05-13 MED FILL — ROSUVASTATIN CALCIUM 5 MG T: 5 | 84 days supply | Qty: 24 | Fill #0

## 2019-06-10 MED FILL — HYDROCHLOROTHIAZIDE 12.5 MG: 12.5 | 90 days supply | Qty: 90 | Fill #2

## 2019-06-10 MED FILL — ESCITALOPRAM 10 MG TABLET: 10 | 90 days supply | Qty: 90 | Fill #1

## 2019-06-11 DIAGNOSIS — Z87410 Personal history of cervical dysplasia: Secondary | ICD-10-CM | POA: Diagnosis not present

## 2019-06-11 DIAGNOSIS — Z6827 Body mass index (BMI) 27.0-27.9, adult: Secondary | ICD-10-CM | POA: Diagnosis not present

## 2019-06-11 DIAGNOSIS — Z779 Other contact with and (suspected) exposures hazardous to health: Secondary | ICD-10-CM | POA: Diagnosis not present

## 2019-06-11 DIAGNOSIS — Z124 Encounter for screening for malignant neoplasm of cervix: Secondary | ICD-10-CM | POA: Diagnosis not present

## 2019-06-12 DIAGNOSIS — H25813 Combined forms of age-related cataract, bilateral: Secondary | ICD-10-CM | POA: Diagnosis not present

## 2019-06-12 DIAGNOSIS — H2513 Age-related nuclear cataract, bilateral: Secondary | ICD-10-CM | POA: Diagnosis not present

## 2019-07-24 MED FILL — LOSARTAN POTASSIUM 100 MG T: 100 | 90 days supply | Qty: 90 | Fill #2

## 2019-07-24 MED FILL — AMLODIPINE BESYLATE 5 MG TA: 5 | 90 days supply | Qty: 90 | Fill #1

## 2019-07-24 MED FILL — ROSUVASTATIN CALCIUM 5 MG T: 5 | 84 days supply | Qty: 24 | Fill #1

## 2019-09-10 MED FILL — ESCITALOPRAM 10 MG TABLET: 10 | 90 days supply | Qty: 90 | Fill #2

## 2019-09-10 MED FILL — HYDROCHLOROTHIAZIDE 12.5 MG: 12.5 | 90 days supply | Qty: 90 | Fill #3

## 2019-09-12 MED FILL — ALPRAZolam 0.5 MG TABS: 0.5 | 30 days supply | Qty: 60 | Fill #0

## 2019-10-24 ENCOUNTER — Other Ambulatory Visit: Payer: Self-pay | Admitting: Obstetrics and Gynecology

## 2019-10-24 DIAGNOSIS — Z1231 Encounter for screening mammogram for malignant neoplasm of breast: Secondary | ICD-10-CM

## 2019-10-29 MED FILL — AMLODIPINE BESYLATE 5 MG TA: 5 | 90 days supply | Qty: 90 | Fill #2

## 2019-10-29 MED FILL — ALPRAZolam 0.5 MG TABS: 0.5 | 30 days supply | Qty: 60 | Fill #1

## 2019-10-29 MED FILL — ROSUVASTATIN CALCIUM 5 MG T: 5 | 84 days supply | Qty: 24 | Fill #2

## 2019-11-14 MED FILL — LOSARTAN POTASSIUM 100 MG T: 100 | 90 days supply | Qty: 90 | Fill #3

## 2019-12-10 ENCOUNTER — Ambulatory Visit
Admission: RE | Admit: 2019-12-10 | Discharge: 2019-12-10 | Disposition: A | Discharge status: Home / Self Care | Payer: Self-pay | Source: Ambulatory Visit | Attending: Obstetrics and Gynecology | Admitting: Obstetrics and Gynecology

## 2019-12-10 ENCOUNTER — Other Ambulatory Visit: Payer: Self-pay

## 2019-12-10 ENCOUNTER — Other Ambulatory Visit: Payer: Self-pay | Admitting: Obstetrics and Gynecology

## 2019-12-10 DIAGNOSIS — Z1231 Encounter for screening mammogram for malignant neoplasm of breast: Secondary | ICD-10-CM

## 2019-12-11 ENCOUNTER — Other Ambulatory Visit (HOSPITAL_BASED_OUTPATIENT_CLINIC_OR_DEPARTMENT_OTHER): Payer: Self-pay | Admitting: Endocrinology

## 2019-12-11 MED FILL — HYDROCHLOROTHIAZIDE 12.5 MG: 12.5 | 90 days supply | Qty: 90 | Fill #0

## 2019-12-11 MED FILL — ALPRAZolam 0.5 MG TABS: 0.5 | 30 days supply | Qty: 60 | Fill #2

## 2019-12-16 ENCOUNTER — Other Ambulatory Visit (HOSPITAL_BASED_OUTPATIENT_CLINIC_OR_DEPARTMENT_OTHER): Payer: Self-pay | Admitting: Endocrinology

## 2019-12-16 MED FILL — ESCITALOPRAM 10 MG TABLET: 10 | 90 days supply | Qty: 90 | Fill #0

## 2020-01-28 ENCOUNTER — Other Ambulatory Visit (HOSPITAL_BASED_OUTPATIENT_CLINIC_OR_DEPARTMENT_OTHER): Payer: Self-pay | Admitting: Dentistry

## 2020-01-28 MED FILL — AMOXICILLIN 875 MG TABS: 875 | 10 days supply | Qty: 20 | Fill #0

## 2020-01-28 MED FILL — CHLORHEXIDINE 0.12% RINSE: 0.12 | 30 days supply | Qty: 473 | Fill #0

## 2020-03-03 ENCOUNTER — Other Ambulatory Visit (HOSPITAL_BASED_OUTPATIENT_CLINIC_OR_DEPARTMENT_OTHER): Payer: Self-pay | Admitting: Endocrinology

## 2020-03-03 MED FILL — ROSUVASTATIN CALCIUM 5 MG T: 5 | 84 days supply | Qty: 24 | Fill #0

## 2020-03-03 MED FILL — ALPRAZolam 0.5 MG TABS: 0.5 | 30 days supply | Qty: 60 | Fill #3

## 2020-03-03 MED FILL — LOSARTAN POTASSIUM 100 MG T: 100 | 90 days supply | Qty: 90 | Fill #0

## 2020-03-03 MED FILL — AMLODIPINE BESYLATE 5 MG TA: 5 | 90 days supply | Qty: 90 | Fill #3

## 2020-03-05 DIAGNOSIS — Z23 Encounter for immunization: Secondary | ICD-10-CM | POA: Diagnosis not present

## 2020-04-07 MED FILL — ESCITALOPRAM 10 MG TABLET: 10 | 90 days supply | Qty: 90 | Fill #1

## 2020-04-07 MED FILL — HYDROCHLOROTHIAZIDE 12.5 MG: 12.5 | 90 days supply | Qty: 90 | Fill #1

## 2020-07-15 DIAGNOSIS — Z124 Encounter for screening for malignant neoplasm of cervix: Secondary | ICD-10-CM | POA: Diagnosis not present

## 2020-07-15 DIAGNOSIS — Z6828 Body mass index (BMI) 28.0-28.9, adult: Secondary | ICD-10-CM | POA: Diagnosis not present

## 2020-07-15 DIAGNOSIS — N958 Other specified menopausal and perimenopausal disorders: Secondary | ICD-10-CM | POA: Diagnosis not present

## 2020-11-16 ENCOUNTER — Encounter: Payer: Self-pay | Admitting: Gastroenterology

## 2021-01-21 ENCOUNTER — Other Ambulatory Visit: Payer: Self-pay | Admitting: Obstetrics and Gynecology

## 2021-01-21 DIAGNOSIS — Z1231 Encounter for screening mammogram for malignant neoplasm of breast: Secondary | ICD-10-CM

## 2021-02-06 DIAGNOSIS — Z23 Encounter for immunization: Secondary | ICD-10-CM | POA: Diagnosis not present

## 2021-02-24 ENCOUNTER — Other Ambulatory Visit: Payer: Self-pay

## 2021-02-24 ENCOUNTER — Ambulatory Visit
Admission: RE | Admit: 2021-02-24 | Discharge: 2021-02-24 | Disposition: A | Discharge status: Home / Self Care | Payer: Medicare Other | Source: Ambulatory Visit | Attending: Obstetrics and Gynecology | Admitting: Obstetrics and Gynecology

## 2021-02-24 DIAGNOSIS — Z1231 Encounter for screening mammogram for malignant neoplasm of breast: Secondary | ICD-10-CM

## 2021-03-25 DIAGNOSIS — H52223 Regular astigmatism, bilateral: Secondary | ICD-10-CM | POA: Diagnosis not present

## 2021-03-25 DIAGNOSIS — H5213 Myopia, bilateral: Secondary | ICD-10-CM | POA: Diagnosis not present

## 2021-03-25 DIAGNOSIS — H168 Other keratitis: Secondary | ICD-10-CM | POA: Diagnosis not present

## 2021-03-25 DIAGNOSIS — H524 Presbyopia: Secondary | ICD-10-CM | POA: Diagnosis not present

## 2021-03-29 ENCOUNTER — Encounter: Payer: Self-pay | Admitting: Gastroenterology

## 2021-04-01 DIAGNOSIS — H168 Other keratitis: Secondary | ICD-10-CM | POA: Diagnosis not present

## 2021-05-25 ENCOUNTER — Ambulatory Visit (AMBULATORY_SURGERY_CENTER): Payer: MEDICARE | Admitting: *Deleted

## 2021-05-25 ENCOUNTER — Other Ambulatory Visit: Payer: Self-pay

## 2021-05-25 VITALS — Ht 62.0 in | Wt 140.0 lb

## 2021-05-25 DIAGNOSIS — Z86010 Personal history of colonic polyps: Secondary | ICD-10-CM

## 2021-05-25 MED ORDER — PLENVU 140 G PO SOLR: 1.0000 | ORAL | 0 refills | Status: DC

## 2021-05-25 NOTE — Progress Notes (Signed)
No egg or soy allergy known to patient  No issues known to pt with past sedation with any surgeries or procedures Patient denies ever being told they had issues or difficulty with intubation  No FH of Malignant Hyperthermia Pt is not on diet pills Pt is not on  home 02  Pt is not on blood thinners  Pt denies issues with constipation  No A fib or A flutter  Pt is NOT vaccinated  for Covid   PLENVU MEDICARE  Coupon to pt in PV today , Code to Pharmacy and  NO PA's for preps discussed with pt In PV today  Discussed with pt there will be an out-of-pocket cost for prep and that varies from $0 to 60 +  dollars - pt verbalized understanding Pt given the option in PV today for Golytely prep verses  alternative prep  ( Suprep/Plenvu)-  Pt is aware the Golytely has more volume but is more cost effective and the Suprep/Plenvu is less volume but may cost $60 .  Pt voiced understanding of this and choose PLENVU  Prep. WITH MEDICARE COUPON     Due to the COVID-19 pandemic we are asking patients to follow certain guidelines in PV and the [facility]   Pt aware of COVID protocols and [facility] guidelines   PV completed over the phone. Pt verified name, DOB, address and insurance during PV today.  Pt mailed instruction packet with copy of consent form to read and not return, and instructions.  Pt encouraged to call with questions or issues.  If pt has My chart, procedure instructions sent via My Chart

## 2021-06-09 ENCOUNTER — Encounter: Payer: Self-pay | Admitting: Gastroenterology

## 2021-06-09 ENCOUNTER — Other Ambulatory Visit: Payer: Self-pay

## 2021-06-09 ENCOUNTER — Ambulatory Visit (AMBULATORY_SURGERY_CENTER): Payer: MEDICARE | Admitting: Gastroenterology

## 2021-06-09 VITALS — BP 129/77 | HR 69 | Temp 98.2°F | Resp 18 | Ht 62.0 in | Wt 140.0 lb

## 2021-06-09 DIAGNOSIS — D120 Benign neoplasm of cecum: Secondary | ICD-10-CM | POA: Diagnosis not present

## 2021-06-09 DIAGNOSIS — D123 Benign neoplasm of transverse colon: Secondary | ICD-10-CM

## 2021-06-09 DIAGNOSIS — D122 Benign neoplasm of ascending colon: Secondary | ICD-10-CM | POA: Diagnosis not present

## 2021-06-09 DIAGNOSIS — Z86010 Personal history of colonic polyps: Secondary | ICD-10-CM

## 2021-06-09 MED ORDER — SODIUM CHLORIDE 0.9 % IV SOLN: 500.0000 mL | Freq: Once | INTRAVENOUS | Status: DC

## 2021-06-09 NOTE — Progress Notes (Signed)
To pacu, VSS. Report to [email or web address] 

## 2021-06-09 NOTE — Progress Notes (Signed)
[facility] History and Physical   Primary Care Physician:  Reynold Bowen, MD   Reason for Procedure:  History of adenomatous colon polyps  Plan:    Surveillance colonoscopy with possible interventions as needed     HPI: Tara Chung is a very pleasant 71 y.o. female here for surveillance colonoscopy. Denies any nausea, vomiting, abdominal pain, melena or bright red blood per rectum  The risks and benefits as well as alternatives of endoscopic procedure(s) have been discussed and reviewed. All questions answered. The patient agrees to proceed.    Past Medical History:  Diagnosis Date   Allergy    SEASONAL   Cataract    lt. eye     GERD (gastroesophageal reflux disease)    Hyperlipidemia    diet controlled  no prescribed meds   Hypertension    Osteoporosis    osteopenia    Past Surgical History:  Procedure Laterality Date   AUGMENTATION MAMMAPLASTY Bilateral 1985   Saline, behind the muscle    COLONOSCOPY     POLYPECTOMY     TUBAL LIGATION      Prior to Admission medications   Medication Sig Start Date End Date Taking? Authorizing Provider  amLODipine (NORVASC) 5 MG tablet Take 5 mg by mouth daily. 04/19/21  Yes [provider]  Ascorbic Acid (VITAMIN C) 1000 MG tablet Take 1,000 mg by mouth daily. VITAMIN C COMPLEX   Yes [provider]  Cyanocobalamin (VITAMIN B 12 PO) Take by mouth.   Yes [provider]  escitalopram (LEXAPRO) 10 MG tablet TAKE 1 TABLEY BY MOUTH EVERY DAY ONCE A DAY 12/16/19 06/09/21 Yes Reynold Bowen, MD  losartan (COZAAR) 100 MG tablet Take 100 mg daily by mouth. 12/28/16  Yes [provider]  rosuvastatin (CRESTOR) 5 MG tablet TAKE 1 TABLET BY MOUTH TWICE WEEKLY 03/03/20 06/09/21 Yes Forde Dandy, Annie Main, MD  ALPRAZolam Duanne Moron) 0.5 MG tablet alprazolam 0.5 mg tablet  TAKE ONE-HALF TO 1 TABLET BY MOUTH TWICE DAILY AS NEEDED FOR ANXIETY    [provider]  hydrochlorothiazide (HYDRODIURIL)  12.5 MG tablet TAKE 1 TABLET BY MOUTH ONCE DAILY 12/11/19 05/25/21  Reynold Bowen, MD    Current Outpatient Medications  Medication Sig Dispense Refill   amLODipine (NORVASC) 5 MG tablet Take 5 mg by mouth daily.     Ascorbic Acid (VITAMIN C) 1000 MG tablet Take 1,000 mg by mouth daily. VITAMIN C COMPLEX     Cyanocobalamin (VITAMIN B 12 PO) Take by mouth.     escitalopram (LEXAPRO) 10 MG tablet TAKE 1 TABLEY BY MOUTH EVERY DAY ONCE A DAY 90 tablet 3   losartan (COZAAR) 100 MG tablet Take 100 mg daily by mouth.  3   rosuvastatin (CRESTOR) 5 MG tablet TAKE 1 TABLET BY MOUTH TWICE WEEKLY 24 tablet 3   ALPRAZolam (XANAX) 0.5 MG tablet alprazolam 0.5 mg tablet  TAKE ONE-HALF TO 1 TABLET BY MOUTH TWICE DAILY AS NEEDED FOR ANXIETY     hydrochlorothiazide (HYDRODIURIL) 12.5 MG tablet TAKE 1 TABLET BY MOUTH ONCE DAILY 90 tablet 1   Current Facility-Administered Medications  Medication Dose Route Frequency Provider Last Rate Last Admin   0.9 %  sodium chloride infusion  500 mL Intravenous Once Nikeria Kalman V, MD       0.9 %  sodium chloride infusion  500 mL Intravenous Once Mauri Pole, MD        Allergies as of 06/09/2021   (No Known Allergies)    Family History  Problem Relation Age of Onset   Colon polyps Father    Breast cancer Neg Hx    Colon cancer Neg Hx    Esophageal cancer Neg Hx    Rectal cancer Neg Hx    Stomach cancer Neg Hx     Social History   Socioeconomic History   Marital status: Divorced    Spouse name: Not on file   Number of children: Not on file   Years of education: Not on file   Highest education level: Not on file  Occupational History   Not on file  Tobacco Use   Smoking status: Former    Types: Cigarettes    Quit date: 05/12/2014    Years since quitting: 7.0    Passive exposure: Never   Smokeless tobacco: Never  Substance and Sexual Activity   Alcohol use: Yes    Alcohol/week: 2.0 standard drinks    Types: 2 Glasses of wine per week     Comment: 3-4 times weekly   Drug use: No   Sexual activity: Not on file  Other Topics Concern   Not on file  Social History Narrative   Not on file   Social Determinants of Health   Financial Resource Strain: Not on file  Food Insecurity: Not on file  Transportation Needs: Not on file  Physical Activity: Not on file  Stress: Not on file  Social Connections: Not on file  Intimate Partner Violence: Not on file    Review of Systems:  All other review of systems negative except as mentioned in the HPI.  Physical Exam: Vital signs in last 24 hours: BP (!) 161/79    Pulse 79    Temp 98.2 F (36.8 C)    Ht 5\' 2"  (1.575 m)    Wt 140 lb (63.5 kg)    SpO2 96%    BMI 25.61 kg/m  General:   Alert, NAD Lungs:  Clear .   Heart:  Regular rate and rhythm Abdomen:  Soft, nontender and nondistended. Neuro/Psych:  Alert and cooperative. Normal mood and affect. A and O x 3  Reviewed labs, radiology imaging, old records and pertinent past GI work up  Patient is appropriate for planned procedure(s) and anesthesia in an ambulatory setting   K. Denzil Magnuson , MD [phone]

## 2021-06-09 NOTE — Progress Notes (Signed)
Called to room to assist during endoscopic procedure.  Patient ID and intended procedure confirmed with present staff. Received instructions for my participation in the procedure from the performing physician.  

## 2021-06-09 NOTE — Op Note (Signed)
[place] Patient Name: Tara Chung Procedure Date: 06/09/2021 8:16 AM MRN: [id number] Endoscopist: Mauri Pole , MD Age: 71 Referring MD:  Date of Birth: 1950-08-04 Gender: Female Account #: [id number] Procedure:                Colonoscopy Indications:              High risk colon cancer surveillance: Personal                            history of colonic polyps, High risk colon cancer                            surveillance: Personal history of multiple (3 or                            more) adenomas Medicines:                Monitored Anesthesia Care Procedure:                Pre-Anesthesia Assessment:                           - Prior to the procedure, a History and Physical                            was performed, and patient medications and                            allergies were reviewed. The patient's tolerance of                            previous anesthesia was also reviewed. The risks                            and benefits of the procedure and the sedation                            options and risks were discussed with the patient.                            All questions were answered, and informed consent                            was obtained. Prior Anticoagulants: The patient has                            taken no previous anticoagulant or antiplatelet                            agents. ASA Grade Assessment: II - A patient with                            mild systemic disease. After reviewing the risks  and benefits, the patient was deemed in                            satisfactory condition to undergo the procedure.                           After obtaining informed consent, the colonoscope                            was passed under direct vision. Throughout the                            procedure, the patient's blood pressure, pulse, and                            oxygen saturations were monitored  continuously. The                            PCF-HQ190L Colonoscope was introduced through the                            anus and advanced to the the cecum, identified by                            appendiceal orifice and ileocecal valve. The                            colonoscopy was performed without difficulty. The                            patient tolerated the procedure well. The quality                            of the bowel preparation was excellent. The                            ileocecal valve, appendiceal orifice, and rectum                            were photographed. Scope In: 0:73:71 AM Scope Out: 9:11:35 AM Scope Withdrawal Time: 0 hours 20 minutes 55 seconds  Total Procedure Duration: 0 hours 24 minutes 47 seconds  Findings:                 The perianal and digital rectal examinations were                            normal.                           Five sessile polyps were found in the transverse                            colon, ascending colon and cecum. The polyps were 4  to 9 mm in size. These polyps were removed with a                            cold snare. Resection and retrieval were complete.                           A 25 mm polyp was found in the ascending colon. The                            polyp was multi-lobulated. The polyp was removed                            with a piecemeal technique using a cold snare.                            Resection and retrieval were complete.                           Scattered small-mouthed diverticula were found in                            the sigmoid colon, descending colon, transverse                            colon and ascending colon. There was no evidence of                            diverticular bleeding.                           Non-bleeding external and internal hemorrhoids were                            found during retroflexion. The hemorrhoids were                             medium-sized. Complications:            No immediate complications. Estimated Blood Loss:     Estimated blood loss was minimal. Impression:               - Five 4 to 9 mm polyps in the transverse colon, in                            the ascending colon and in the cecum, removed with                            a cold snare. Resected and retrieved.                           - One 25 mm polyp in the ascending colon, removed                            piecemeal using a cold snare. Resected and  retrieved.                           - Mild diverticulosis in the sigmoid colon, in the                            descending colon, in the transverse colon and in                            the ascending colon. There was no evidence of                            diverticular bleeding.                           - Non-bleeding external and internal hemorrhoids. Recommendation:           - No aspirin, ibuprofen, naproxen, or other                            non-steroidal anti-inflammatory drugs for 2 weeks                            after polyp removal.                           - Resume previous diet.                           - Continue present medications.                           - Await pathology results.                           - Repeat colonoscopy in 1 year to review the                            polypectomy site. Mauri Pole, MD 06/09/2021 9:21:04 AM This report has been signed electronically.

## 2021-06-09 NOTE — Progress Notes (Signed)
To Pacu, VSS. Report to [email or web address] 

## 2021-06-09 NOTE — Patient Instructions (Signed)
Please read handouts provided. Continue present medications. Await pathology results. No aspirin, ibuprofen,  naproxen, or other non-steriodal anti-inflammatory drugs for 2 weeks. Repeat colonoscopy in 1 year.   YOU HAD AN ENDOSCOPIC PROCEDURE TODAY AT [facility]:   Refer to the procedure report that was given to you for any specific questions about what was found during the examination.  If the procedure report does not answer your questions, please call your gastroenterologist to clarify.  If you requested that your care partner not be given the details of your procedure findings, then the procedure report has been included in a sealed envelope for you to review at your convenience later.  YOU SHOULD EXPECT: Some feelings of bloating in the abdomen. Passage of more gas than usual.  Walking can help get rid of the air that was put into your GI tract during the procedure and reduce the bloating. If you had a lower endoscopy (such as a colonoscopy or flexible sigmoidoscopy) you may notice spotting of blood in your stool or on the toilet paper. If you underwent a bowel prep for your procedure, you may not have a normal bowel movement for a few days.  Please Note:  You might notice some irritation and congestion in your nose or some drainage.  This is from the oxygen used during your procedure.  There is no need for concern and it should clear up in a day or so.  SYMPTOMS TO REPORT IMMEDIATELY:  Following lower endoscopy (colonoscopy or flexible sigmoidoscopy):  Excessive amounts of blood in the stool  Significant tenderness or worsening of abdominal pains  Swelling of the abdomen that is new, acute  Fever of 100F or higher   For urgent or emergent issues, a gastroenterologist can be reached at any hour by calling [phone]. Do not use MyChart messaging for urgent concerns.    DIET:  We do recommend a small meal at first, but then you may proceed to your regular diet.   Drink plenty of fluids but you should avoid alcoholic beverages for 24 hours.  ACTIVITY:  You should plan to take it easy for the rest of today and you should NOT DRIVE or use heavy machinery until tomorrow (because of the sedation medicines used during the test).    FOLLOW UP: Our staff will call the number listed on your records 48-72 hours following your procedure to check on you and address any questions or concerns that you may have regarding the information given to you following your procedure. If we do not reach you, we will leave a message.  We will attempt to reach you two times.  During this call, we will ask if you have developed any symptoms of COVID 19. If you develop any symptoms (ie: fever, flu-like symptoms, shortness of breath, cough etc.) before then, please call [phone].  If you test positive for Covid 19 in the 2 weeks post procedure, please call and report this information to [place].    If any biopsies were taken you will be contacted by phone or by letter within the next 1-3 weeks.  Please call us at [phone] if you have not heard about the biopsies in 3 weeks.    SIGNATURES/CONFIDENTIALITY: You and/or your care partner have signed paperwork which will be entered into your electronic medical record.  These signatures attest to the fact that that the information above on your After Visit Summary has been reviewed and is understood.  Full responsibility of the  confidentiality of this discharge information lies with you and/or your care-partner.

## 2021-06-11 ENCOUNTER — Telehealth: Payer: Self-pay | Admitting: *Deleted

## 2021-06-11 NOTE — Telephone Encounter (Signed)
Attempted f/u phone call. No answer. Left message. °

## 2021-06-11 NOTE — Telephone Encounter (Signed)
No answer on second attempt follow up call.

## 2021-06-18 DIAGNOSIS — E039 Hypothyroidism, unspecified: Secondary | ICD-10-CM | POA: Diagnosis not present

## 2021-06-18 DIAGNOSIS — R3129 Other microscopic hematuria: Secondary | ICD-10-CM | POA: Diagnosis not present

## 2021-06-18 DIAGNOSIS — F411 Generalized anxiety disorder: Secondary | ICD-10-CM | POA: Diagnosis not present

## 2021-06-18 DIAGNOSIS — I10 Essential (primary) hypertension: Secondary | ICD-10-CM | POA: Diagnosis not present

## 2021-06-18 DIAGNOSIS — D126 Benign neoplasm of colon, unspecified: Secondary | ICD-10-CM | POA: Diagnosis not present

## 2021-06-18 DIAGNOSIS — R7301 Impaired fasting glucose: Secondary | ICD-10-CM | POA: Diagnosis not present

## 2021-06-18 DIAGNOSIS — E785 Hyperlipidemia, unspecified: Secondary | ICD-10-CM | POA: Diagnosis not present

## 2021-06-22 ENCOUNTER — Encounter: Payer: Self-pay | Admitting: Gastroenterology

## 2021-07-08 DIAGNOSIS — L570 Actinic keratosis: Secondary | ICD-10-CM | POA: Diagnosis not present

## 2021-07-08 DIAGNOSIS — L821 Other seborrheic keratosis: Secondary | ICD-10-CM | POA: Diagnosis not present

## 2021-07-08 DIAGNOSIS — Z411 Encounter for cosmetic surgery: Secondary | ICD-10-CM | POA: Diagnosis not present

## 2021-07-08 DIAGNOSIS — L814 Other melanin hyperpigmentation: Secondary | ICD-10-CM | POA: Diagnosis not present

## 2021-07-19 DIAGNOSIS — Z124 Encounter for screening for malignant neoplasm of cervix: Secondary | ICD-10-CM | POA: Diagnosis not present

## 2021-07-19 DIAGNOSIS — Z6828 Body mass index (BMI) 28.0-28.9, adult: Secondary | ICD-10-CM | POA: Diagnosis not present

## 2021-07-19 DIAGNOSIS — Z87410 Personal history of cervical dysplasia: Secondary | ICD-10-CM | POA: Diagnosis not present

## 2021-07-19 DIAGNOSIS — N952 Postmenopausal atrophic vaginitis: Secondary | ICD-10-CM | POA: Diagnosis not present

## 2021-10-06 DIAGNOSIS — I10 Essential (primary) hypertension: Secondary | ICD-10-CM | POA: Diagnosis not present

## 2021-10-06 DIAGNOSIS — F411 Generalized anxiety disorder: Secondary | ICD-10-CM | POA: Diagnosis not present

## 2021-10-06 DIAGNOSIS — E039 Hypothyroidism, unspecified: Secondary | ICD-10-CM | POA: Diagnosis not present

## 2021-10-06 DIAGNOSIS — E785 Hyperlipidemia, unspecified: Secondary | ICD-10-CM | POA: Diagnosis not present

## 2021-11-30 DIAGNOSIS — E663 Overweight: Secondary | ICD-10-CM | POA: Diagnosis not present

## 2021-11-30 DIAGNOSIS — R7301 Impaired fasting glucose: Secondary | ICD-10-CM | POA: Diagnosis not present

## 2021-11-30 DIAGNOSIS — E119 Type 2 diabetes mellitus without complications: Secondary | ICD-10-CM | POA: Diagnosis not present

## 2021-11-30 DIAGNOSIS — I10 Essential (primary) hypertension: Secondary | ICD-10-CM | POA: Diagnosis not present

## 2022-01-25 DIAGNOSIS — E663 Overweight: Secondary | ICD-10-CM | POA: Diagnosis not present

## 2022-01-25 DIAGNOSIS — I10 Essential (primary) hypertension: Secondary | ICD-10-CM | POA: Diagnosis not present

## 2022-01-25 DIAGNOSIS — Z23 Encounter for immunization: Secondary | ICD-10-CM | POA: Diagnosis not present

## 2022-01-25 DIAGNOSIS — E119 Type 2 diabetes mellitus without complications: Secondary | ICD-10-CM | POA: Diagnosis not present

## 2022-01-26 ENCOUNTER — Other Ambulatory Visit: Payer: Self-pay | Admitting: Obstetrics and Gynecology

## 2022-01-26 DIAGNOSIS — Z1231 Encounter for screening mammogram for malignant neoplasm of breast: Secondary | ICD-10-CM

## 2022-03-29 ENCOUNTER — Ambulatory Visit: Payer: MEDICARE

## 2022-03-29 DIAGNOSIS — E663 Overweight: Secondary | ICD-10-CM | POA: Diagnosis not present

## 2022-03-29 DIAGNOSIS — I10 Essential (primary) hypertension: Secondary | ICD-10-CM | POA: Diagnosis not present

## 2022-03-29 DIAGNOSIS — E119 Type 2 diabetes mellitus without complications: Secondary | ICD-10-CM | POA: Diagnosis not present

## 2022-03-31 ENCOUNTER — Ambulatory Visit
Admission: RE | Admit: 2022-03-31 | Discharge: 2022-03-31 | Disposition: A | Discharge status: Home / Self Care | Payer: MEDICARE | Source: Ambulatory Visit | Attending: Obstetrics and Gynecology | Admitting: Obstetrics and Gynecology

## 2022-03-31 DIAGNOSIS — Z1231 Encounter for screening mammogram for malignant neoplasm of breast: Secondary | ICD-10-CM | POA: Diagnosis not present

## 2022-05-11 ENCOUNTER — Encounter: Payer: Self-pay | Admitting: Gastroenterology

## 2022-05-31 DIAGNOSIS — L570 Actinic keratosis: Secondary | ICD-10-CM | POA: Diagnosis not present

## 2022-05-31 DIAGNOSIS — L578 Other skin changes due to chronic exposure to nonionizing radiation: Secondary | ICD-10-CM | POA: Diagnosis not present

## 2022-05-31 DIAGNOSIS — L821 Other seborrheic keratosis: Secondary | ICD-10-CM | POA: Diagnosis not present

## 2022-05-31 DIAGNOSIS — B001 Herpesviral vesicular dermatitis: Secondary | ICD-10-CM | POA: Diagnosis not present

## 2022-05-31 DIAGNOSIS — D1801 Hemangioma of skin and subcutaneous tissue: Secondary | ICD-10-CM | POA: Diagnosis not present

## 2022-05-31 DIAGNOSIS — Z411 Encounter for cosmetic surgery: Secondary | ICD-10-CM | POA: Diagnosis not present

## 2022-05-31 DIAGNOSIS — L814 Other melanin hyperpigmentation: Secondary | ICD-10-CM | POA: Diagnosis not present

## 2022-05-31 DIAGNOSIS — D229 Melanocytic nevi, unspecified: Secondary | ICD-10-CM | POA: Diagnosis not present

## 2022-06-07 ENCOUNTER — Ambulatory Visit (AMBULATORY_SURGERY_CENTER): Payer: MEDICARE | Admitting: *Deleted

## 2022-06-07 VITALS — Ht 62.0 in | Wt 134.0 lb

## 2022-06-07 DIAGNOSIS — Z86010 Personal history of colonic polyps: Secondary | ICD-10-CM

## 2022-06-07 DIAGNOSIS — Z800 Family history of malignant neoplasm of digestive organs: Secondary | ICD-10-CM

## 2022-06-07 MED ORDER — NA SULFATE-K SULFATE-MG SULF 17.5-3.13-1.6 GM/177ML PO SOLN: 1.0000 | Freq: Once | ORAL | 0 refills | Status: AC

## 2022-06-07 NOTE — Progress Notes (Signed)
No egg or soy allergy known to patient  No issues known to pt with past sedation with any surgeries or procedures Patient denies ever being told they had issues or difficulty with intubation  No FH of Malignant Hyperthermia Pt is not on diet pills Pt is not on  home 02  Pt is not on blood thinners  Pt denies issues with constipation  No A fib or A flutter Have any cardiac testing pending--NO Pt instructed to use Singlecare.com or GoodRx for a price reduction on prep    Patient's chart reviewed by Osvaldo Angst CNRA prior to previsit and patient appropriate for the [place].  Previsit completed and red dot placed by patient's name on their procedure day (on provider's schedule).

## 2022-06-22 DIAGNOSIS — E785 Hyperlipidemia, unspecified: Secondary | ICD-10-CM | POA: Diagnosis not present

## 2022-06-22 DIAGNOSIS — E663 Overweight: Secondary | ICD-10-CM | POA: Diagnosis not present

## 2022-06-22 DIAGNOSIS — E039 Hypothyroidism, unspecified: Secondary | ICD-10-CM | POA: Diagnosis not present

## 2022-06-22 DIAGNOSIS — E119 Type 2 diabetes mellitus without complications: Secondary | ICD-10-CM | POA: Diagnosis not present

## 2022-06-22 DIAGNOSIS — F411 Generalized anxiety disorder: Secondary | ICD-10-CM | POA: Diagnosis not present

## 2022-06-22 DIAGNOSIS — D126 Benign neoplasm of colon, unspecified: Secondary | ICD-10-CM | POA: Diagnosis not present

## 2022-06-22 DIAGNOSIS — R3129 Other microscopic hematuria: Secondary | ICD-10-CM | POA: Diagnosis not present

## 2022-06-22 DIAGNOSIS — I10 Essential (primary) hypertension: Secondary | ICD-10-CM | POA: Diagnosis not present

## 2022-06-28 ENCOUNTER — Encounter: Payer: Self-pay | Admitting: Gastroenterology

## 2022-06-28 ENCOUNTER — Ambulatory Visit (AMBULATORY_SURGERY_CENTER): Payer: MEDICARE | Admitting: Gastroenterology

## 2022-06-28 VITALS — BP 137/53 | HR 66 | Temp 97.8°F | Resp 16 | Ht 62.0 in | Wt 134.0 lb

## 2022-06-28 DIAGNOSIS — Z860100 Personal history of colon polyps, unspecified: Secondary | ICD-10-CM

## 2022-06-28 DIAGNOSIS — E785 Hyperlipidemia, unspecified: Secondary | ICD-10-CM | POA: Diagnosis not present

## 2022-06-28 DIAGNOSIS — Z800 Family history of malignant neoplasm of digestive organs: Secondary | ICD-10-CM | POA: Diagnosis not present

## 2022-06-28 DIAGNOSIS — D122 Benign neoplasm of ascending colon: Secondary | ICD-10-CM

## 2022-06-28 DIAGNOSIS — D123 Benign neoplasm of transverse colon: Secondary | ICD-10-CM

## 2022-06-28 DIAGNOSIS — I10 Essential (primary) hypertension: Secondary | ICD-10-CM | POA: Diagnosis not present

## 2022-06-28 DIAGNOSIS — Z09 Encounter for follow-up examination after completed treatment for conditions other than malignant neoplasm: Secondary | ICD-10-CM

## 2022-06-28 DIAGNOSIS — E119 Type 2 diabetes mellitus without complications: Secondary | ICD-10-CM | POA: Diagnosis not present

## 2022-06-28 MED ORDER — SODIUM CHLORIDE 0.9 % IV SOLN: 500.0000 mL | INTRAVENOUS | Status: DC

## 2022-06-28 NOTE — Progress Notes (Signed)
Called to room to assist during endoscopic procedure.  Patient ID and intended procedure confirmed with present staff. Received instructions for my participation in the procedure from the performing physician.  

## 2022-06-28 NOTE — Patient Instructions (Signed)
Please read handouts provided. Continue present medications. Benefiber 1 tablespoon twice daily. Await pathology results.   YOU HAD AN ENDOSCOPIC PROCEDURE TODAY AT [facility]:   Refer to the procedure report that was given to you for any specific questions about what was found during the examination.  If the procedure report does not answer your questions, please call your gastroenterologist to clarify.  If you requested that your care partner not be given the details of your procedure findings, then the procedure report has been included in a sealed envelope for you to review at your convenience later.  YOU SHOULD EXPECT: Some feelings of bloating in the abdomen. Passage of more gas than usual.  Walking can help get rid of the air that was put into your GI tract during the procedure and reduce the bloating. If you had a lower endoscopy (such as a colonoscopy or flexible sigmoidoscopy) you may notice spotting of blood in your stool or on the toilet paper. If you underwent a bowel prep for your procedure, you may not have a normal bowel movement for a few days.  Please Note:  You might notice some irritation and congestion in your nose or some drainage.  This is from the oxygen used during your procedure.  There is no need for concern and it should clear up in a day or so.  SYMPTOMS TO REPORT IMMEDIATELY:  Following lower endoscopy (colonoscopy or flexible sigmoidoscopy):  Excessive amounts of blood in the stool  Significant tenderness or worsening of abdominal pains  Swelling of the abdomen that is new, acute  Fever of 100F or higher For urgent or emergent issues, a gastroenterologist can be reached at any hour by calling [phone]. Do not use MyChart messaging for urgent concerns.    DIET:  We do recommend a small meal at first, but then you may proceed to your regular diet.  Drink plenty of fluids but you should avoid alcoholic beverages for 24 hours.  ACTIVITY:   You should plan to take it easy for the rest of today and you should NOT DRIVE or use heavy machinery until tomorrow (because of the sedation medicines used during the test).    FOLLOW UP: Our staff will call the number listed on your records the next business day following your procedure.  We will call around 7:15- 8:00 am to check on you and address any questions or concerns that you may have regarding the information given to you following your procedure. If we do not reach you, we will leave a message.     If any biopsies were taken you will be contacted by phone or by letter within the next 1-3 weeks.  Please call us at [phone] if you have not heard about the biopsies in 3 weeks.    SIGNATURES/CONFIDENTIALITY: You and/or your care partner have signed paperwork which will be entered into your electronic medical record.  These signatures attest to the fact that that the information above on your After Visit Summary has been reviewed and is understood.  Full responsibility of the confidentiality of this discharge information lies with you and/or your care-partner.

## 2022-06-28 NOTE — Progress Notes (Signed)
[facility] History and Physical   Primary Care Physician:  Reynold Bowen, MD   Reason for Procedure:  History of adenomatous colon polyps  Plan:    Surveillance colonoscopy with possible interventions as needed     HPI: Tara Chung is a very pleasant 72 y.o. female here for surveillance colonoscopy. Denies any nausea, vomiting, abdominal pain, melena or bright red blood per rectum  The risks and benefits as well as alternatives of endoscopic procedure(s) have been discussed and reviewed. All questions answered. The patient agrees to proceed.    Past Medical History:  Diagnosis Date   Allergy    SEASONAL   Cataract    lt. eye     Diabetes mellitus without complication (HCC)    GERD (gastroesophageal reflux disease)    Hyperlipidemia    diet controlled  no prescribed meds   Hypertension    Osteoporosis    osteopenia    Past Surgical History:  Procedure Laterality Date   AUGMENTATION MAMMAPLASTY Bilateral 1985   Saline, behind the muscle    COLONOSCOPY     POLYPECTOMY     TUBAL LIGATION      Prior to Admission medications   Medication Sig Start Date End Date Taking? Authorizing Provider  amLODipine (NORVASC) 5 MG tablet Take 5 mg by mouth daily. 04/19/21  Yes [provider]  Ascorbic Acid (VITAMIN C) 1000 MG tablet Take 1,000 mg by mouth daily. VITAMIN C COMPLEX   Yes [provider]  BIOTIN PO Take by mouth daily. ONE TABLET DAILY   Yes [provider]  Cyanocobalamin (VITAMIN B 12 PO) Take by mouth daily.   Yes [provider]  escitalopram (LEXAPRO) 10 MG tablet TAKE 1 TABLET BY MOUTH EVERY DAY for 90 days   Yes [provider]  hydrochlorothiazide (HYDRODIURIL) 12.5 MG tablet Take 1 tablet by mouth daily.   Yes [provider]  losartan (COZAAR) 100 MG tablet Take 100 mg daily by mouth. 12/28/16  Yes [provider]  metFORMIN (GLUCOPHAGE-XR) 500 MG 24 hr tablet Take 500 mg by mouth  2 (two) times daily.   Yes [provider]  omeprazole (PRILOSEC) 20 MG capsule Take 20 mg by mouth daily.   Yes [provider]  OZEMPIC, 1 MG/DOSE, 4 MG/3ML SOPN Inject 1 mg into the skin once a week.   Yes [provider]  rosuvastatin (CRESTOR) 5 MG tablet TAKE 1 TABLET BY MOUTH TWICE WEEKLY for 90 days   Yes [provider]  ALPRAZolam (XANAX) 0.5 MG tablet alprazolam 0.5 mg tablet  TAKE ONE-HALF TO 1 TABLET BY MOUTH TWICE DAILY AS NEEDED FOR ANXIETY    [provider]  escitalopram (LEXAPRO) 10 MG tablet TAKE 1 TABLEY BY MOUTH EVERY DAY ONCE A DAY 12/16/19 06/09/21  Reynold Bowen, MD  hydrochlorothiazide (HYDRODIURIL) 12.5 MG tablet TAKE 1 TABLET BY MOUTH ONCE DAILY 12/11/19 05/25/21  Reynold Bowen, MD  rosuvastatin (CRESTOR) 5 MG tablet TAKE 1 TABLET BY MOUTH TWICE WEEKLY 03/03/20 06/09/21  Reynold Bowen, MD    Current Outpatient Medications  Medication Sig Dispense Refill   amLODipine (NORVASC) 5 MG tablet Take 5 mg by mouth daily.     Ascorbic Acid (VITAMIN C) 1000 MG tablet Take 1,000 mg by mouth daily. VITAMIN C COMPLEX     BIOTIN PO Take by mouth daily. ONE TABLET DAILY     Cyanocobalamin (VITAMIN B 12 PO) Take by mouth daily.     escitalopram (LEXAPRO) 10 MG  tablet TAKE 1 TABLET BY MOUTH EVERY DAY for 90 days     hydrochlorothiazide (HYDRODIURIL) 12.5 MG tablet Take 1 tablet by mouth daily.     losartan (COZAAR) 100 MG tablet Take 100 mg daily by mouth.  3   metFORMIN (GLUCOPHAGE-XR) 500 MG 24 hr tablet Take 500 mg by mouth 2 (two) times daily.     omeprazole (PRILOSEC) 20 MG capsule Take 20 mg by mouth daily.     OZEMPIC, 1 MG/DOSE, 4 MG/3ML SOPN Inject 1 mg into the skin once a week.     rosuvastatin (CRESTOR) 5 MG tablet TAKE 1 TABLET BY MOUTH TWICE WEEKLY for 90 days     ALPRAZolam (XANAX) 0.5 MG tablet alprazolam 0.5 mg tablet  TAKE ONE-HALF TO 1 TABLET BY MOUTH TWICE DAILY AS NEEDED FOR ANXIETY     escitalopram (LEXAPRO) 10 MG  tablet TAKE 1 TABLEY BY MOUTH EVERY DAY ONCE A DAY 90 tablet 3   hydrochlorothiazide (HYDRODIURIL) 12.5 MG tablet TAKE 1 TABLET BY MOUTH ONCE DAILY 90 tablet 1   rosuvastatin (CRESTOR) 5 MG tablet TAKE 1 TABLET BY MOUTH TWICE WEEKLY 24 tablet 3   Current Facility-Administered Medications  Medication Dose Route Frequency Provider Last Rate Last Admin   0.9 %  sodium chloride infusion  500 mL Intravenous Once Jennfer Gassen V, MD       0.9 %  sodium chloride infusion  500 mL Intravenous Continuous Halla Chopp, Venia Minks, MD        Allergies as of 06/28/2022   (No Known Allergies)    Family History  Problem Relation Age of Onset   Colon cancer Mother    Colon polyps Father    Colon polyps Brother    Breast cancer Neg Hx    Esophageal cancer Neg Hx    Rectal cancer Neg Hx    Stomach cancer Neg Hx    Crohn's disease Neg Hx    Ulcerative colitis Neg Hx     Social History   Socioeconomic History   Marital status: Divorced    Spouse name: Not on file   Number of children: Not on file   Years of education: Not on file   Highest education level: Not on file  Occupational History   Not on file  Tobacco Use   Smoking status: Former    Types: Cigarettes    Quit date: 05/12/2014    Years since quitting: 8.1    Passive exposure: Never   Smokeless tobacco: Never  Vaping Use   Vaping Use: Never used  Substance and Sexual Activity   Alcohol use: Yes    Alcohol/week: 2.0 standard drinks of alcohol    Types: 2 Glasses of wine per week    Comment: 3-4 times weekly   Drug use: No   Sexual activity: Not on file  Other Topics Concern   Not on file  Social History Narrative   Not on file   Social Determinants of Health   Financial Resource Strain: Not on file  Food Insecurity: Not on file  Transportation Needs: Not on file  Physical Activity: Not on file  Stress: Not on file  Social Connections: Not on file  Intimate Partner Violence: Not on file    Review of  Systems:  All other review of systems negative except as mentioned in the HPI.  Physical Exam: Vital signs in last 24 hours: Blood Pressure (Abnormal) 141/64   Pulse 79   Temperature 97.8 F (36.6 C) (Temporal)  Height '5\' 2"'$  (1.575 m)   Weight 134 lb (60.8 kg)   Oxygen Saturation 97%   Body Mass Index 24.51 kg/m  General:   Alert, NAD Lungs:  Clear .   Heart:  Regular rate and rhythm Abdomen:  Soft, nontender and nondistended. Neuro/Psych:  Alert and cooperative. Normal mood and affect. A and O x 3  Reviewed labs, radiology imaging, old records and pertinent past GI work up  Patient is appropriate for planned procedure(s) and anesthesia in an ambulatory setting   K. Denzil Magnuson , MD [phone]

## 2022-06-28 NOTE — Progress Notes (Signed)
Sedate, gd SR, tolerated procedure well, VSS, report to RN 

## 2022-06-28 NOTE — Progress Notes (Signed)
Pt's states no medical or surgical changes since previsit or office visit. 

## 2022-06-28 NOTE — Op Note (Addendum)
[place] Patient Name: Emilija Hustead Procedure Date: 06/28/2022 8:35 AM MRN: [id number] Endoscopist: Mauri Pole , MD, [id number] Age: 72 Referring MD:  Date of Birth: March 03, 1951 Gender: Female Account #: [id number] Procedure:                Colonoscopy Indications:              Surveillance: Piecemeal removal of large sessile                            adenoma last colonoscopy (< 3 yrs), High risk colon                            cancer surveillance: Personal history of adenoma                            (10 mm or greater in size), High risk colon cancer                            surveillance: Personal history of sessile serrated                            colon polyp (10 mm or greater in size) Medicines:                Monitored Anesthesia Care Procedure:                Pre-Anesthesia Assessment:                           - Prior to the procedure, a History and Physical                            was performed, and patient medications and                            allergies were reviewed. The patient's tolerance of                            previous anesthesia was also reviewed. The risks                            and benefits of the procedure and the sedation                            options and risks were discussed with the patient.                            All questions were answered, and informed consent                            was obtained. Prior Anticoagulants: The patient has                            taken no anticoagulant or antiplatelet agents. ASA  Grade Assessment: II - A patient with mild systemic                            disease. After reviewing the risks and benefits,                            the patient was deemed in satisfactory condition to                            undergo the procedure.                           After obtaining informed consent, the colonoscope                            was passed  under direct vision. Throughout the                            procedure, the patient's blood pressure, pulse, and                            oxygen saturations were monitored continuously. The                            Olympus [id number]) Colonoscope was                            introduced through the anus and advanced to the the                            cecum, identified by appendiceal orifice and                            ileocecal valve. The colonoscopy was performed                            without difficulty. The patient tolerated the                            procedure well. The quality of the bowel                            preparation was good. The ileocecal valve,                            appendiceal orifice, and rectum were photographed. Scope In: 8:55:24 AM Scope Out: 9:12:23 AM Scope Withdrawal Time: 0 hours 13 minutes 53 seconds  Total Procedure Duration: 0 hours 16 minutes 59 seconds  Findings:                 The perianal and digital rectal examinations were                            normal.  A 10 mm polyp was found in the ascending colon. The                            polyp was sessile. The polyp was removed with a                            cold snare. Resection and retrieval were complete.                           A 5 mm polyp was found in the transverse colon. The                            polyp was sessile. The polyp was removed with a                            cold snare. Resection and retrieval were complete.                           Scattered small-mouthed diverticula were found in                            the sigmoid colon and descending colon.                           Non-bleeding external and internal hemorrhoids were                            found during retroflexion. The hemorrhoids were                            medium-sized. Complications:            No immediate complications. Estimated Blood  Loss:     Estimated blood loss was minimal. Impression:               - One 10 mm polyp in the ascending colon, removed                            with a cold snare. Resected and retrieved.                           - One 5 mm polyp in the transverse colon, removed                            with a cold snare. Resected and retrieved.                           - Diverticulosis in the sigmoid colon and in the                            descending colon.                           - Non-bleeding external and internal hemorrhoids.                           -  The [facility] [employer]ius (intelligent endoscopy module),                            computer-aided polyp detection system powered by AI                            was utilized to detect colorectal polyps through                            enhanced visualization during colonoscopy. Recommendation:           - Patient has a contact number available for                            emergencies. The signs and symptoms of potential                            delayed complications were discussed with the                            patient. Return to normal activities tomorrow.                            Written discharge instructions were provided to the                            patient.                           - Resume previous diet.                           - Continue present medications.                           - Await pathology results.                           - Repeat colonoscopy in 3 - 5 years for                            surveillance based on pathology results. Mauri Pole, MD 06/28/2022 9:17:28 AM This report has been signed electronically.

## 2022-06-29 ENCOUNTER — Telehealth: Payer: Self-pay | Admitting: *Deleted

## 2022-06-29 NOTE — Telephone Encounter (Signed)
Attempted to call patient for their post-procedure follow-up call. No answer. Left voicemail.   

## 2022-07-01 ENCOUNTER — Encounter: Payer: Self-pay | Admitting: Gastroenterology

## 2022-07-15 IMAGING — MG DIGITAL SCREENING BREAST BILAT IMPLANT W/ TOMO W/ CAD
8 of 13 series · 8 of 29 positions shown · non-contrast
Comparison: Previous exam(s).

CLINICAL DATA: Screening.

EXAM:
DIGITAL SCREENING BILATERAL MAMMOGRAM WITH IMPLANTS, CAD AND
TOMOSYNTHESIS
TECHNIQUE: Bilateral screening digital craniocaudal and mediolateral oblique
mammograms were obtained. Bilateral screening digital breast
tomosynthesis was performed. The images were evaluated with
computer-aided detection. Standard and/or implant displaced views
were performed.

[R CC]
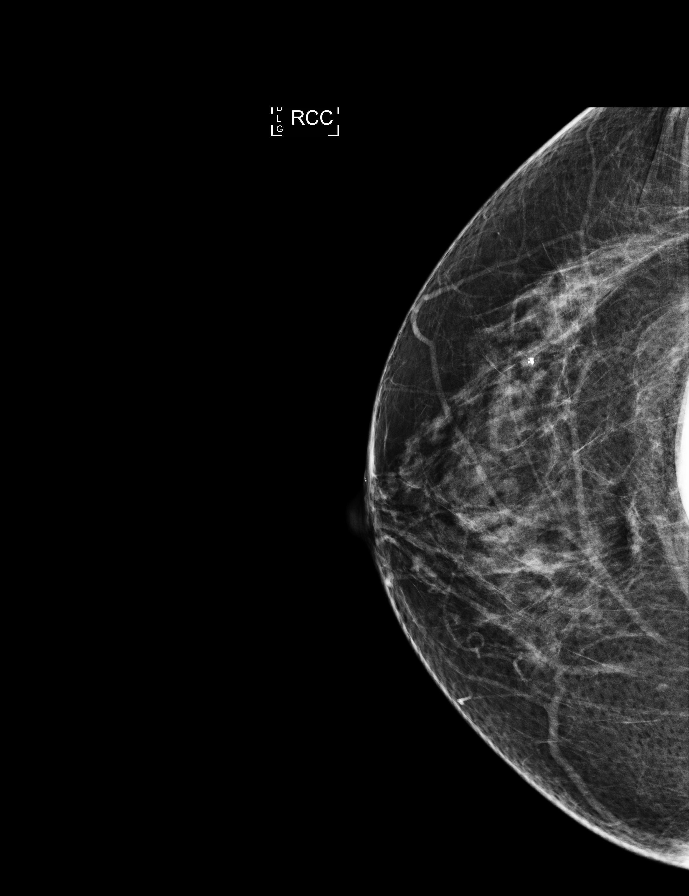

[L CC (1 of 2)]
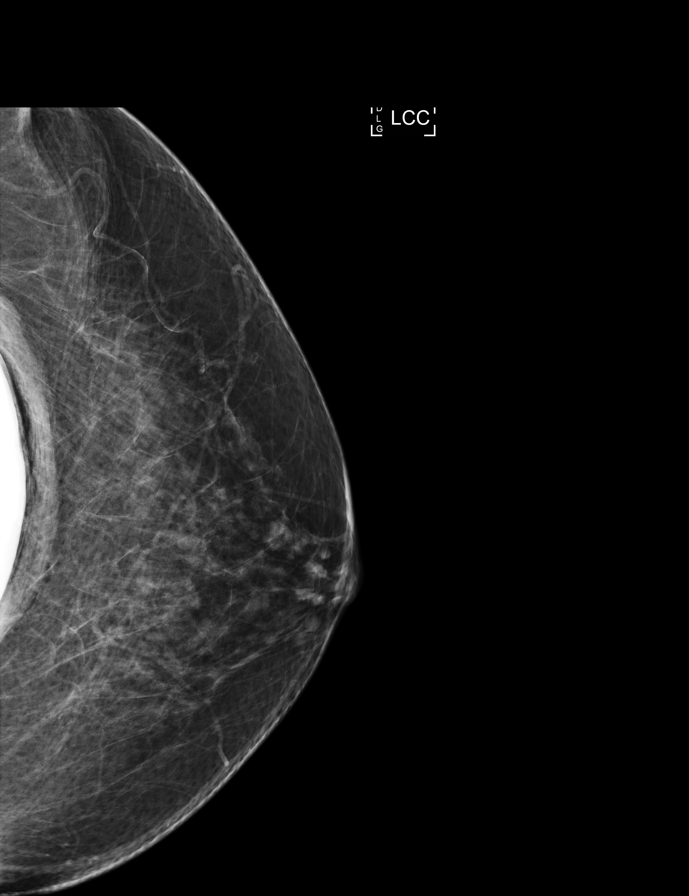

[L CC (2 of 2)]
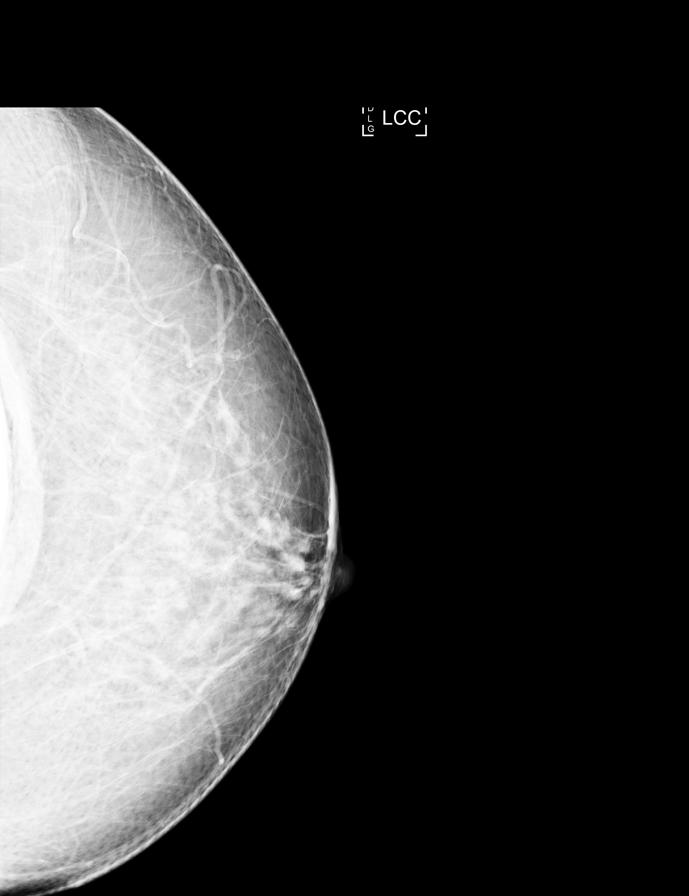

[L MLO]
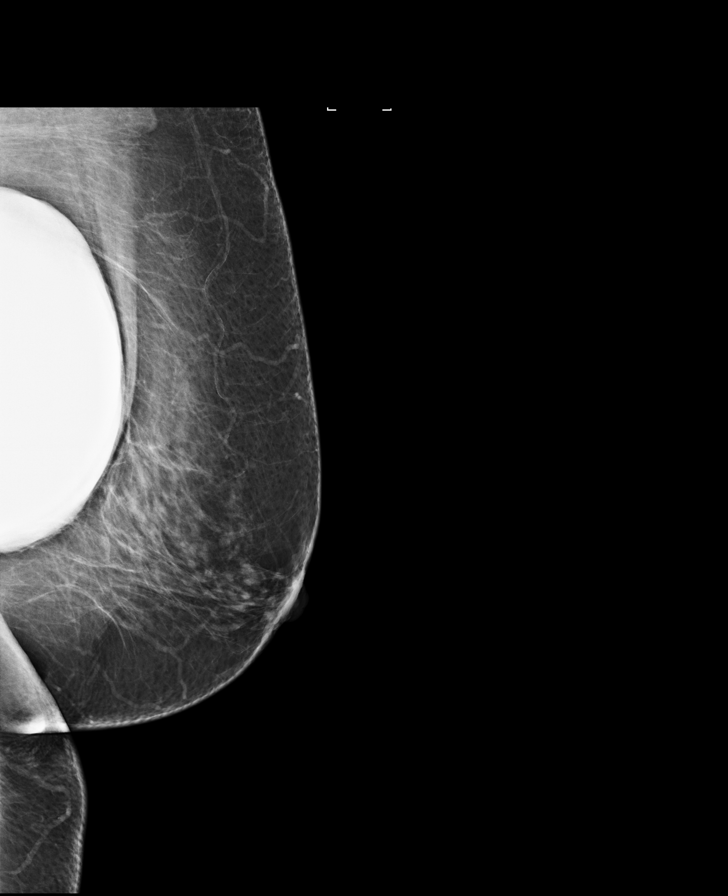

[R MLO]
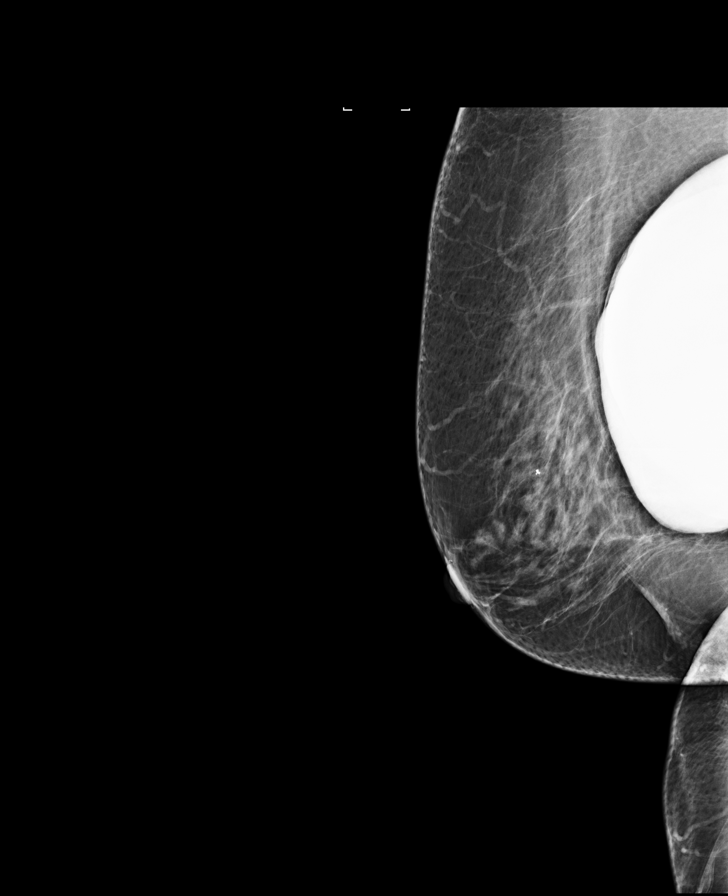

[R MLO synth-2D]
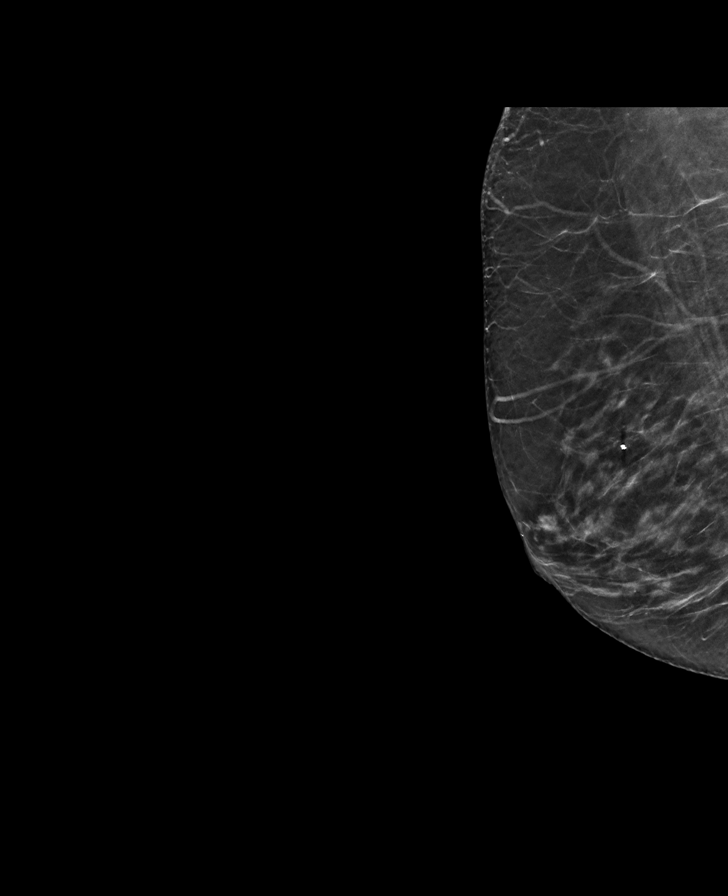

[L CC synth-2D]
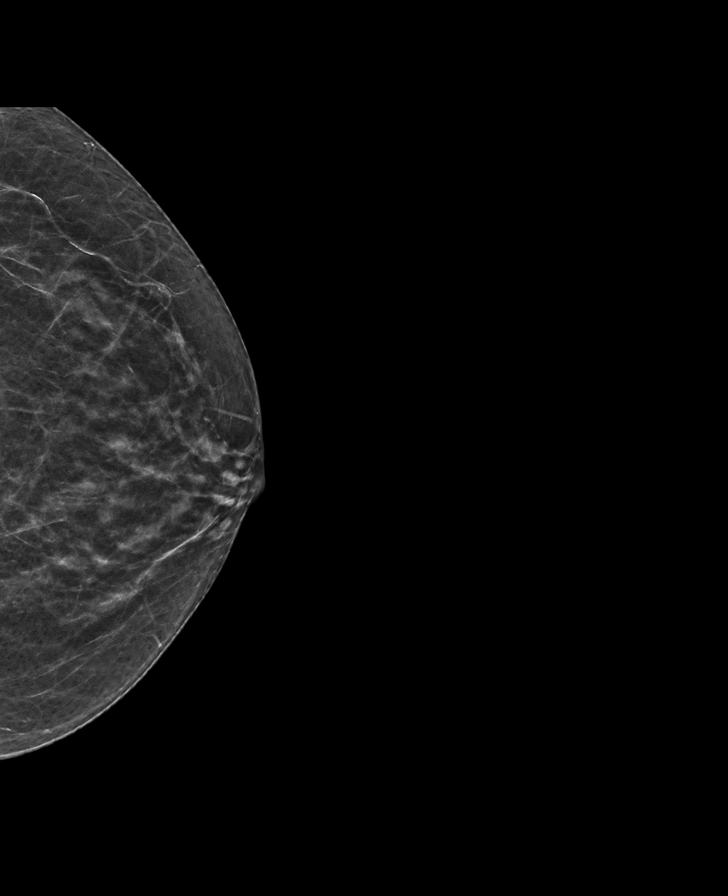

[L MLO synth-2D]
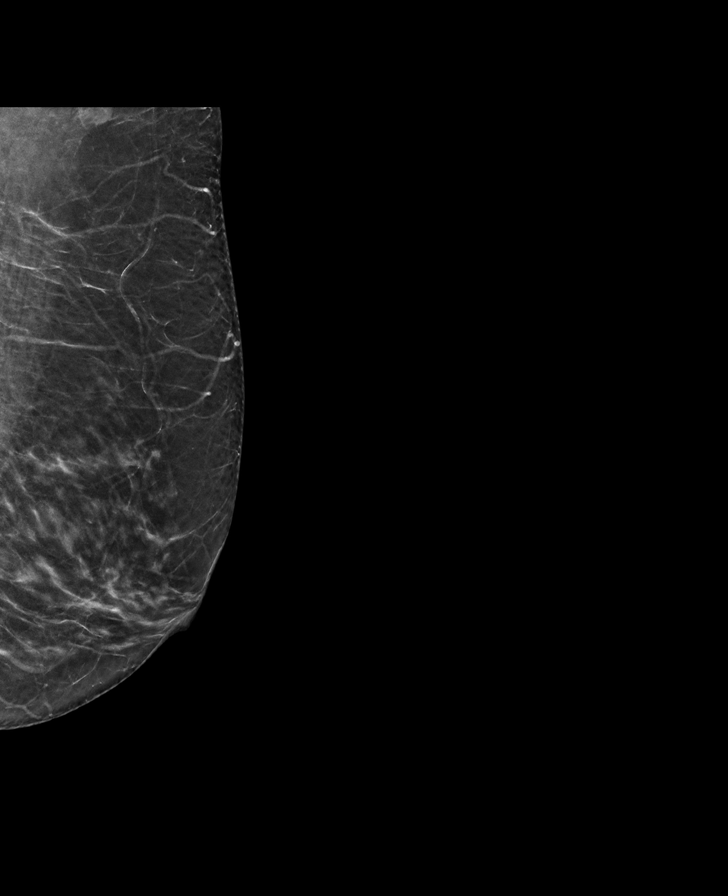

[8 of 29 positions shown; findings below may reference images not displayed]

ACR Breast Density Category b: There are scattered areas of
fibroglandular density.
FINDINGS: The patient has retropectoral silicone implants. There are no
findings suspicious for malignancy.
IMPRESSION: No mammographic evidence of malignancy. A result letter of this
screening mammogram will be mailed directly to the patient.

RECOMMENDATION:
Screening mammogram in one year. (Code:AF-I-1PX)

BI-RADS CATEGORY  1:  Negative.

## 2022-07-22 DIAGNOSIS — N952 Postmenopausal atrophic vaginitis: Secondary | ICD-10-CM | POA: Diagnosis not present

## 2022-07-22 DIAGNOSIS — Z124 Encounter for screening for malignant neoplasm of cervix: Secondary | ICD-10-CM | POA: Diagnosis not present

## 2022-07-22 DIAGNOSIS — Z6825 Body mass index (BMI) 25.0-25.9, adult: Secondary | ICD-10-CM | POA: Diagnosis not present

## 2022-10-28 DIAGNOSIS — I10 Essential (primary) hypertension: Secondary | ICD-10-CM | POA: Diagnosis not present

## 2022-10-28 DIAGNOSIS — E119 Type 2 diabetes mellitus without complications: Secondary | ICD-10-CM | POA: Diagnosis not present

## 2022-10-28 DIAGNOSIS — E039 Hypothyroidism, unspecified: Secondary | ICD-10-CM | POA: Diagnosis not present

## 2022-10-28 DIAGNOSIS — E785 Hyperlipidemia, unspecified: Secondary | ICD-10-CM | POA: Diagnosis not present

## 2022-11-04 DIAGNOSIS — R82998 Other abnormal findings in urine: Secondary | ICD-10-CM | POA: Diagnosis not present

## 2022-11-04 DIAGNOSIS — E039 Hypothyroidism, unspecified: Secondary | ICD-10-CM | POA: Diagnosis not present

## 2022-11-04 DIAGNOSIS — Z1339 Encounter for screening examination for other mental health and behavioral disorders: Secondary | ICD-10-CM | POA: Diagnosis not present

## 2022-11-04 DIAGNOSIS — Z0000 Encounter for general adult medical examination without abnormal findings: Secondary | ICD-10-CM | POA: Diagnosis not present

## 2022-11-04 DIAGNOSIS — E663 Overweight: Secondary | ICD-10-CM | POA: Diagnosis not present

## 2022-11-04 DIAGNOSIS — Z1331 Encounter for screening for depression: Secondary | ICD-10-CM | POA: Diagnosis not present

## 2022-11-04 DIAGNOSIS — E785 Hyperlipidemia, unspecified: Secondary | ICD-10-CM | POA: Diagnosis not present

## 2022-11-04 DIAGNOSIS — F411 Generalized anxiety disorder: Secondary | ICD-10-CM | POA: Diagnosis not present

## 2022-11-04 DIAGNOSIS — E119 Type 2 diabetes mellitus without complications: Secondary | ICD-10-CM | POA: Diagnosis not present

## 2022-11-04 DIAGNOSIS — D126 Benign neoplasm of colon, unspecified: Secondary | ICD-10-CM | POA: Diagnosis not present

## 2022-11-04 DIAGNOSIS — I10 Essential (primary) hypertension: Secondary | ICD-10-CM | POA: Diagnosis not present

## 2023-03-14 ENCOUNTER — Other Ambulatory Visit: Payer: Self-pay | Admitting: Endocrinology

## 2023-03-14 DIAGNOSIS — E785 Hyperlipidemia, unspecified: Secondary | ICD-10-CM | POA: Diagnosis not present

## 2023-03-14 DIAGNOSIS — Z1231 Encounter for screening mammogram for malignant neoplasm of breast: Secondary | ICD-10-CM

## 2023-03-14 DIAGNOSIS — D126 Benign neoplasm of colon, unspecified: Secondary | ICD-10-CM | POA: Diagnosis not present

## 2023-03-14 DIAGNOSIS — F411 Generalized anxiety disorder: Secondary | ICD-10-CM | POA: Diagnosis not present

## 2023-03-14 DIAGNOSIS — Z23 Encounter for immunization: Secondary | ICD-10-CM | POA: Diagnosis not present

## 2023-03-14 DIAGNOSIS — E119 Type 2 diabetes mellitus without complications: Secondary | ICD-10-CM | POA: Diagnosis not present

## 2023-03-14 DIAGNOSIS — E039 Hypothyroidism, unspecified: Secondary | ICD-10-CM | POA: Diagnosis not present

## 2023-03-14 DIAGNOSIS — I10 Essential (primary) hypertension: Secondary | ICD-10-CM | POA: Diagnosis not present

## 2023-03-14 DIAGNOSIS — E663 Overweight: Secondary | ICD-10-CM | POA: Diagnosis not present

## 2023-03-23 DIAGNOSIS — H53143 Visual discomfort, bilateral: Secondary | ICD-10-CM | POA: Diagnosis not present

## 2023-03-23 DIAGNOSIS — E119 Type 2 diabetes mellitus without complications: Secondary | ICD-10-CM | POA: Diagnosis not present

## 2023-04-20 ENCOUNTER — Ambulatory Visit: Payer: MEDICARE

## 2023-04-27 ENCOUNTER — Ambulatory Visit
Admission: RE | Admit: 2023-04-27 | Discharge: 2023-04-27 | Disposition: A | Discharge status: Home / Self Care | Payer: MEDICARE | Source: Ambulatory Visit | Attending: Endocrinology | Admitting: Endocrinology

## 2023-04-27 DIAGNOSIS — Z1231 Encounter for screening mammogram for malignant neoplasm of breast: Secondary | ICD-10-CM

## 2023-06-01 DIAGNOSIS — D225 Melanocytic nevi of trunk: Secondary | ICD-10-CM | POA: Diagnosis not present

## 2023-06-01 DIAGNOSIS — L821 Other seborrheic keratosis: Secondary | ICD-10-CM | POA: Diagnosis not present

## 2023-06-01 DIAGNOSIS — Z411 Encounter for cosmetic surgery: Secondary | ICD-10-CM | POA: Diagnosis not present

## 2023-06-01 DIAGNOSIS — L814 Other melanin hyperpigmentation: Secondary | ICD-10-CM | POA: Diagnosis not present

## 2023-06-01 DIAGNOSIS — D1801 Hemangioma of skin and subcutaneous tissue: Secondary | ICD-10-CM | POA: Diagnosis not present

## 2023-06-01 DIAGNOSIS — D485 Neoplasm of uncertain behavior of skin: Secondary | ICD-10-CM | POA: Diagnosis not present

## 2023-06-01 DIAGNOSIS — D229 Melanocytic nevi, unspecified: Secondary | ICD-10-CM | POA: Diagnosis not present

## 2023-06-01 DIAGNOSIS — L578 Other skin changes due to chronic exposure to nonionizing radiation: Secondary | ICD-10-CM | POA: Diagnosis not present

## 2023-06-01 DIAGNOSIS — B001 Herpesviral vesicular dermatitis: Secondary | ICD-10-CM | POA: Diagnosis not present

## 2023-06-01 DIAGNOSIS — D235 Other benign neoplasm of skin of trunk: Secondary | ICD-10-CM | POA: Diagnosis not present

## 2023-06-21 DIAGNOSIS — D235 Other benign neoplasm of skin of trunk: Secondary | ICD-10-CM | POA: Diagnosis not present

## 2023-06-21 DIAGNOSIS — D239 Other benign neoplasm of skin, unspecified: Secondary | ICD-10-CM | POA: Diagnosis not present

## 2023-06-21 DIAGNOSIS — L905 Scar conditions and fibrosis of skin: Secondary | ICD-10-CM | POA: Diagnosis not present

## 2023-07-31 DIAGNOSIS — N952 Postmenopausal atrophic vaginitis: Secondary | ICD-10-CM | POA: Diagnosis not present

## 2023-07-31 DIAGNOSIS — Z6826 Body mass index (BMI) 26.0-26.9, adult: Secondary | ICD-10-CM | POA: Diagnosis not present

## 2023-07-31 DIAGNOSIS — Z124 Encounter for screening for malignant neoplasm of cervix: Secondary | ICD-10-CM | POA: Diagnosis not present

## 2023-10-31 DIAGNOSIS — E119 Type 2 diabetes mellitus without complications: Secondary | ICD-10-CM | POA: Diagnosis not present

## 2023-10-31 DIAGNOSIS — R7989 Other specified abnormal findings of blood chemistry: Secondary | ICD-10-CM | POA: Diagnosis not present

## 2023-10-31 DIAGNOSIS — E039 Hypothyroidism, unspecified: Secondary | ICD-10-CM | POA: Diagnosis not present

## 2023-10-31 DIAGNOSIS — I10 Essential (primary) hypertension: Secondary | ICD-10-CM | POA: Diagnosis not present

## 2023-10-31 DIAGNOSIS — Z1212 Encounter for screening for malignant neoplasm of rectum: Secondary | ICD-10-CM | POA: Diagnosis not present

## 2023-10-31 DIAGNOSIS — E785 Hyperlipidemia, unspecified: Secondary | ICD-10-CM | POA: Diagnosis not present

## 2023-11-07 DIAGNOSIS — E785 Hyperlipidemia, unspecified: Secondary | ICD-10-CM | POA: Diagnosis not present

## 2023-11-07 DIAGNOSIS — F411 Generalized anxiety disorder: Secondary | ICD-10-CM | POA: Diagnosis not present

## 2023-11-07 DIAGNOSIS — I10 Essential (primary) hypertension: Secondary | ICD-10-CM | POA: Diagnosis not present

## 2023-11-07 DIAGNOSIS — D126 Benign neoplasm of colon, unspecified: Secondary | ICD-10-CM | POA: Diagnosis not present

## 2023-11-07 DIAGNOSIS — Z1331 Encounter for screening for depression: Secondary | ICD-10-CM | POA: Diagnosis not present

## 2023-11-07 DIAGNOSIS — E039 Hypothyroidism, unspecified: Secondary | ICD-10-CM | POA: Diagnosis not present

## 2023-11-07 DIAGNOSIS — E663 Overweight: Secondary | ICD-10-CM | POA: Diagnosis not present

## 2023-11-07 DIAGNOSIS — E119 Type 2 diabetes mellitus without complications: Secondary | ICD-10-CM | POA: Diagnosis not present

## 2023-11-07 DIAGNOSIS — Z0000 Encounter for general adult medical examination without abnormal findings: Secondary | ICD-10-CM | POA: Diagnosis not present

## 2023-11-07 DIAGNOSIS — R3129 Other microscopic hematuria: Secondary | ICD-10-CM | POA: Diagnosis not present

## 2023-11-07 DIAGNOSIS — R82998 Other abnormal findings in urine: Secondary | ICD-10-CM | POA: Diagnosis not present

## 2023-11-07 DIAGNOSIS — Z23 Encounter for immunization: Secondary | ICD-10-CM | POA: Diagnosis not present

## 2023-11-07 DIAGNOSIS — Z1339 Encounter for screening examination for other mental health and behavioral disorders: Secondary | ICD-10-CM | POA: Diagnosis not present

## 2024-03-12 DIAGNOSIS — L570 Actinic keratosis: Secondary | ICD-10-CM | POA: Diagnosis not present

## 2024-03-12 DIAGNOSIS — L821 Other seborrheic keratosis: Secondary | ICD-10-CM | POA: Diagnosis not present

## 2024-03-27 ENCOUNTER — Other Ambulatory Visit: Payer: Self-pay | Admitting: Obstetrics and Gynecology

## 2024-03-27 DIAGNOSIS — Z1231 Encounter for screening mammogram for malignant neoplasm of breast: Secondary | ICD-10-CM

## 2024-04-30 ENCOUNTER — Ambulatory Visit
Admission: RE | Admit: 2024-04-30 | Discharge: 2024-04-30 | Disposition: A | Payer: PRIVATE HEALTH INSURANCE | Source: Ambulatory Visit | Attending: Obstetrics and Gynecology | Admitting: Obstetrics and Gynecology

## 2024-04-30 DIAGNOSIS — Z1231 Encounter for screening mammogram for malignant neoplasm of breast: Secondary | ICD-10-CM
